# Patient Record
Sex: Female | Born: 1980 | Race: White | Hispanic: No | Marital: Married | Smoking: Never smoker
Health system: Southern US, Community
[De-identification: ages and names within clinical notes are randomized; demographics above are authoritative.]

## PROBLEM LIST (undated history)

## (undated) ENCOUNTER — Inpatient Hospital Stay (HOSPITAL_COMMUNITY): Payer: Self-pay

## (undated) DIAGNOSIS — I10 Essential (primary) hypertension: Secondary | ICD-10-CM

## (undated) DIAGNOSIS — E785 Hyperlipidemia, unspecified: Secondary | ICD-10-CM

## (undated) DIAGNOSIS — T7840XA Allergy, unspecified, initial encounter: Secondary | ICD-10-CM

## (undated) DIAGNOSIS — O139 Gestational [pregnancy-induced] hypertension without significant proteinuria, unspecified trimester: Secondary | ICD-10-CM

## (undated) DIAGNOSIS — F419 Anxiety disorder, unspecified: Secondary | ICD-10-CM

## (undated) DIAGNOSIS — K219 Gastro-esophageal reflux disease without esophagitis: Secondary | ICD-10-CM

## (undated) DIAGNOSIS — Z789 Other specified health status: Secondary | ICD-10-CM

## (undated) DIAGNOSIS — R87629 Unspecified abnormal cytological findings in specimens from vagina: Secondary | ICD-10-CM

## (undated) DIAGNOSIS — D649 Anemia, unspecified: Secondary | ICD-10-CM

## (undated) HISTORY — DX: Hyperlipidemia, unspecified: E78.5

## (undated) HISTORY — DX: Anxiety disorder, unspecified: F41.9

## (undated) HISTORY — DX: Allergy, unspecified, initial encounter: T78.40XA

## (undated) HISTORY — PX: MANDIBLE SURGERY: SHX707

---

## 2002-11-06 ENCOUNTER — Other Ambulatory Visit: Admission: RE | Admit: 2002-11-06 | Discharge: 2002-11-06 | Payer: Self-pay | Admitting: Obstetrics and Gynecology

## 2003-08-14 ENCOUNTER — Encounter: Admission: RE | Admit: 2003-08-14 | Discharge: 2003-08-14 | Payer: Self-pay | Admitting: *Deleted

## 2003-09-15 ENCOUNTER — Encounter: Admission: RE | Admit: 2003-09-15 | Discharge: 2003-12-14 | Payer: Self-pay | Admitting: *Deleted

## 2003-11-17 ENCOUNTER — Other Ambulatory Visit: Admission: RE | Admit: 2003-11-17 | Discharge: 2003-11-17 | Payer: Self-pay | Admitting: Obstetrics and Gynecology

## 2005-05-17 ENCOUNTER — Ambulatory Visit: Payer: Self-pay | Admitting: Surgery

## 2011-06-20 ENCOUNTER — Ambulatory Visit: Payer: Self-pay | Admitting: General Practice

## 2013-02-04 ENCOUNTER — Emergency Department: Payer: Self-pay | Admitting: Emergency Medicine

## 2013-02-04 LAB — URINALYSIS, COMPLETE
Bacteria: NONE SEEN
Bilirubin,UR: NEGATIVE
Blood: NEGATIVE
Glucose,UR: NEGATIVE mg/dL (ref 0–75)
Ketone: NEGATIVE
Leukocyte Esterase: NEGATIVE
Nitrite: NEGATIVE
Ph: 5 (ref 4.5–8.0)
Protein: NEGATIVE
RBC,UR: <1 /HPF (ref 0–5)
Specific Gravity: 1.005 (ref 1.003–1.030)
Squamous Epithelial: 1
WBC UR: 1 /HPF (ref 0–5)

## 2013-02-04 LAB — PREGNANCY, URINE: Pregnancy Test, Urine: NEGATIVE m[IU]/mL

## 2013-02-04 LAB — COMPREHENSIVE METABOLIC PANEL
Albumin: 3.9 g/dL (ref 3.4–5.0)
Alkaline Phosphatase: 64 U/L (ref 50–136)
Anion Gap: 8 (ref 7–16)
BUN: 7 mg/dL (ref 7–18)
Bilirubin,Total: 0.4 mg/dL (ref 0.2–1.0)
Calcium, Total: 9.1 mg/dL (ref 8.5–10.1)
Chloride: 103 mmol/L (ref 98–107)
Co2: 26 mmol/L (ref 21–32)
Creatinine: 0.78 mg/dL (ref 0.60–1.30)
EGFR (African American): >60
EGFR (Non-African Amer.): >60
Glucose: 88 mg/dL (ref 65–99)
Osmolality: 271 (ref 275–301)
Potassium: 3.8 mmol/L (ref 3.5–5.1)
SGOT(AST): 18 U/L (ref 15–37)
SGPT (ALT): 21 U/L (ref 12–78)
Sodium: 137 mmol/L (ref 136–145)
Total Protein: 8 g/dL (ref 6.4–8.2)

## 2013-02-04 LAB — CBC
HCT: 39.2 % (ref 35.0–47.0)
HGB: 13.9 g/dL (ref 12.0–16.0)
MCH: 30 pg (ref 26.0–34.0)
MCHC: 35.5 g/dL (ref 32.0–36.0)
MCV: 85 fL (ref 80–100)
Platelet: 350 10*3/uL (ref 150–440)
RBC: 4.62 10*6/uL (ref 3.80–5.20)
RDW: 12.7 % (ref 11.5–14.5)
WBC: 8.7 10*3/uL (ref 3.6–11.0)

## 2013-02-04 LAB — TROPONIN I: Troponin-I: <0.02 ng/mL

## 2015-07-27 LAB — OB RESULTS CONSOLE RPR: RPR: NONREACTIVE

## 2015-07-27 LAB — OB RESULTS CONSOLE ABO/RH: RH Type: POSITIVE

## 2015-07-27 LAB — OB RESULTS CONSOLE RUBELLA ANTIBODY, IGM: RUBELLA: IMMUNE

## 2015-07-27 LAB — OB RESULTS CONSOLE HEPATITIS B SURFACE ANTIGEN: Hepatitis B Surface Ag: NEGATIVE

## 2015-07-27 LAB — OB RESULTS CONSOLE ANTIBODY SCREEN: ANTIBODY SCREEN: NEGATIVE

## 2015-07-27 LAB — OB RESULTS CONSOLE HIV ANTIBODY (ROUTINE TESTING): HIV: NONREACTIVE

## 2015-08-05 LAB — OB RESULTS CONSOLE GC/CHLAMYDIA
CHLAMYDIA, DNA PROBE: NEGATIVE
GC PROBE AMP, GENITAL: NEGATIVE

## 2015-10-05 ENCOUNTER — Other Ambulatory Visit (HOSPITAL_COMMUNITY): Payer: Self-pay | Admitting: Obstetrics and Gynecology

## 2015-10-05 DIAGNOSIS — Z3A19 19 weeks gestation of pregnancy: Secondary | ICD-10-CM

## 2015-10-05 DIAGNOSIS — Z3689 Encounter for other specified antenatal screening: Secondary | ICD-10-CM

## 2015-10-05 DIAGNOSIS — R772 Abnormality of alphafetoprotein: Secondary | ICD-10-CM

## 2015-10-06 ENCOUNTER — Other Ambulatory Visit (HOSPITAL_COMMUNITY): Payer: Self-pay | Admitting: Obstetrics and Gynecology

## 2015-10-06 ENCOUNTER — Encounter (HOSPITAL_COMMUNITY): Payer: Self-pay

## 2015-10-06 ENCOUNTER — Ambulatory Visit (HOSPITAL_COMMUNITY)
Admission: RE | Admit: 2015-10-06 | Discharge: 2015-10-06 | Disposition: A | Discharge status: Home / Self Care | Payer: BC Managed Care – PPO | Source: Ambulatory Visit | Attending: Obstetrics and Gynecology | Admitting: Obstetrics and Gynecology

## 2015-10-06 DIAGNOSIS — O280 Abnormal hematological finding on antenatal screening of mother: Secondary | ICD-10-CM

## 2015-10-06 DIAGNOSIS — O99212 Obesity complicating pregnancy, second trimester: Secondary | ICD-10-CM

## 2015-10-06 DIAGNOSIS — R772 Abnormality of alphafetoprotein: Secondary | ICD-10-CM

## 2015-10-06 DIAGNOSIS — O09522 Supervision of elderly multigravida, second trimester: Secondary | ICD-10-CM | POA: Diagnosis not present

## 2015-10-06 DIAGNOSIS — O283 Abnormal ultrasonic finding on antenatal screening of mother: Secondary | ICD-10-CM | POA: Diagnosis not present

## 2015-10-06 DIAGNOSIS — Z3689 Encounter for other specified antenatal screening: Secondary | ICD-10-CM

## 2015-10-06 DIAGNOSIS — Z3A19 19 weeks gestation of pregnancy: Secondary | ICD-10-CM | POA: Diagnosis not present

## 2015-10-06 DIAGNOSIS — Z36 Encounter for antenatal screening of mother: Secondary | ICD-10-CM | POA: Diagnosis not present

## 2015-10-08 ENCOUNTER — Encounter (HOSPITAL_COMMUNITY): Payer: Self-pay

## 2015-10-08 ENCOUNTER — Other Ambulatory Visit (HOSPITAL_COMMUNITY): Payer: Self-pay

## 2015-10-10 NOTE — L&D Delivery Note (Signed)
Delivery Note At 5:13 PM a viable and healthy female was delivered via Vaginal, Spontaneous Delivery (Presentation: LOP_).  APGAR: 9,9 ; weight pending .   Placenta status: spontaneious, intact.  Cord:  with the following complications: [place] reduced.  Cord pH: na  Anesthesia: Epidural  Episiotomy:  na Lacerations:  second Suture Repair: 2.0 vicryl rapide Est. Blood Loss (mL):  100  Mom to postpartum.  Baby to Couplet care / Skin to Skin.  Christina [name] 02/11/2016, 5:32 PM

## 2015-10-15 ENCOUNTER — Ambulatory Visit (HOSPITAL_COMMUNITY): Admission: RE | Admit: 2015-10-15 | Payer: Self-pay | Source: Ambulatory Visit

## 2015-10-15 ENCOUNTER — Encounter (HOSPITAL_COMMUNITY): Payer: Self-pay

## 2015-11-26 ENCOUNTER — Ambulatory Visit (INDEPENDENT_AMBULATORY_CARE_PROVIDER_SITE_OTHER): Payer: BC Managed Care – PPO

## 2015-11-26 ENCOUNTER — Ambulatory Visit
Admission: EM | Admit: 2015-11-26 | Discharge: 2015-11-26 | Disposition: A | Discharge status: Home / Self Care | Payer: BC Managed Care – PPO | Attending: Family Medicine | Admitting: Family Medicine

## 2015-11-26 DIAGNOSIS — M79671 Pain in right foot: Secondary | ICD-10-CM | POA: Diagnosis not present

## 2015-11-26 NOTE — Discharge Instructions (Signed)
Heat Therapy  Heat therapy can help ease sore, stiff, injured, and tight muscles and joints. Heat relaxes your muscles, which may help ease your pain.   RISKS AND COMPLICATIONS  If you have any of the following conditions, do not use heat therapy unless your health care provider has approved:   Poor circulation.   Healing wounds or scarred skin in the area being treated.   Diabetes, heart disease, or high blood pressure.   Not being able to feel (numbness) the area being treated.   Unusual swelling of the area being treated.   Active infections.   Blood clots.   Cancer.   Inability to communicate pain. This may include young children and people who have problems with their brain function (dementia).   Pregnancy.  Heat therapy should only be used on old, pre-existing, or long-lasting (chronic) injuries. Do not use heat therapy on new injuries unless directed by your health care provider.  HOW TO USE HEAT THERAPY  There are several different kinds of heat therapy, including:   Moist heat pack.   Warm water bath.   Hot water bottle.   Electric heating pad.   Heated gel pack.   Heated wrap.   Electric heating pad.  Use the heat therapy method suggested by your health care provider. Follow your health care provider's instructions on when and how to use heat therapy.  GENERAL HEAT THERAPY RECOMMENDATIONS   Do not sleep while using heat therapy. Only use heat therapy while you are awake.   Your skin may turn pink while using heat therapy. Do not use heat therapy if your skin turns red.   Do not use heat therapy if you have new pain.   High heat or long exposure to heat can cause burns. Be careful when using heat therapy to avoid burning your skin.   Do not use heat therapy on areas of your skin that are already irritated, such as with a rash or sunburn.  SEEK MEDICAL CARE IF:   You have blisters, redness, swelling, or numbness.   You have new pain.   Your pain is worse.  MAKE SURE  YOU:   Understand these instructions.   Will watch your condition.   Will get help right away if you are not doing well or get worse.     This information is not intended to replace advice given to you by your health care provider. Make sure you discuss any questions you have with your health care provider.     Document Released: 12/18/2011 Document Revised: 10/16/2014 Document Reviewed: 11/18/2013  Elsevier Interactive Patient Education 2016 [employer]

## 2015-11-26 NOTE — ED Notes (Signed)
States fell off a curb last week and since the beginning of this week has had  Pain top and bottom of right foot. Painful to weight bear

## 2015-11-26 NOTE — ED Provider Notes (Signed)
CSN: [id number]     Arrival date & time 11/26/15  4098 History   First MD Initiated Contact with Patient 11/26/15 1106     Chief Complaint  Patient presents with  . Foot Pain   (Consider location/radiation/quality/duration/timing/severity/associated sxs/prior Treatment) HPI   [name] that is [redacted] weeks pregnant states that one week ago she was not watching where she was going and inadvertently stepped off of a curb twisting her right ankle into eversion. At first it seemed to go away without any problem but now for the last several days as noticed it has become very painful to ambulate. 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 Although this is not her major problem. She states that her pain is mostly on the great toe side and into the arch. She is hobbling whenever she walks.  History reviewed. No pertinent past medical history. Past Surgical History  Procedure Laterality Date  . Mandible surgery     Family History  Problem Relation Age of Onset  . Rheum arthritis Neg Hx   . Osteoarthritis Neg Hx    Social History  Substance Use Topics  . Smoking status: Never Smoker   . Smokeless tobacco: None  . Alcohol Use: No   OB History    Gravida Para Term Preterm AB TAB SAB Ectopic Multiple Living       Review of Systems  Constitutional: Positive for activity change. Negative for fever, diaphoresis, appetite change and fatigue.  Musculoskeletal: Positive for arthralgias.  All other systems reviewed and are negative.   Allergies  Erythromycin  Home Medications   Prior to Admission medications   Medication Sig Start Date End Date Taking? Authorizing Provider  Butalbital-APAP-Caffeine (FIORICET PO) Take by mouth.   Yes Historical Provider, MD  Prenatal Vit-Fe Fumarate-FA (PRENATAL MULTIVITAMIN) TABS tablet Take 1 tablet by  mouth daily at 12 noon.   Yes Historical Provider, MD   Meds Ordered and Administered this Visit  Medications - No data to display  BP 133/97 mmHg  Pulse 108  Temp(Src) 97.9 F (36.6 C) (Tympanic)  Resp 16  Ht  (1.702 m)  Wt 223 lb (101.152 kg)  BMI 34.92 kg/m2  SpO2 100%  LMP 05/15/2015 No data found.   Physical Exam  Constitutional: She is oriented to person, place, and time. She appears well-developed and well-nourished. No distress.  HENT:  Head: Normocephalic and atraumatic.  Eyes: Conjunctivae are normal. Pupils are equal, round, and reactive to light.  Neck: Normal range of motion. Neck supple.  Musculoskeletal: She exhibits tenderness.  Examination of the right foot and ankle shows fairly good range of motion of the ankle to plantar flexion, dorsiflexion and subtalar motion. Is no appreciable erythema ecchymosis or swelling. Distal capillary refill is less than 3 seconds. Maximum tenderness is at the tarsometatarsal joint of the great toe and into the arch of the foot to plantarly. There is no crepitus present.  Neurological: She is alert and oriented to person, place, and time.  Skin: Skin is warm and dry. She is not diaphoretic.  Psychiatric: She has a normal mood and affect. Her behavior is normal. Judgment and thought content normal.  Nursing note and vitals reviewed.   ED Course  Procedures (including critical care time)  Labs Review Labs Reviewed -  No data to display  Imaging Review Dg Foot Complete Right  11/26/2015  CLINICAL DATA:  35 year old female stepped off a curb 1 week ago and injured foot. Pain radiating to the arch. Patient is [redacted] weeks pregnant and was shielded for the exam. Initial encounter. EXAM: RIGHT FOOT COMPLETE - 3+ VIEW COMPARISON:  None. FINDINGS: Bone mineralization is within normal limits. Calcaneus intact. Joint spaces and alignment within normal limits. Tarsal bones intact. Metatarsals appear intact; there is mild benign appearing  cortical thickening along the lateral fourth metatarsal shaft. Phalanges intact. No acute osseous abnormality identified. IMPRESSION: No acute osseous abnormality identified in the right foot. Electronically Signed   By: Odessa Fleming M.D.   On: 11/26/2015 12:31     Visual Acuity Review  Right Eye Distance:   Left Eye Distance:   Bilateral Distance:    Right Eye Near:   Left Eye Near:    Bilateral Near:     13:15:14 Apply cam walker (Boot Orthosis) Completed LW  Apply cam walker (Boot Orthosis)         MDM   1. Foot arch pain, right    Discharge Medication List as of 11/26/2015  1:17 PM    Plan: 1. Test/x-ray results and diagnosis reviewed with patient 2. rx as per orders; risks, benefits, potential side effects reviewed with patient 3. Recommend supportive treatment with rest and elevation as necessary. However with the cam boot for activities and may come out of it to when she is at home quietly. I told her to watch the symptoms for about a week and if they are not improving she should go see a podiatrist. She may have ligamentous damage that is affecting the first TMT joint of her right foot. May need further evaluation and treatment. However out of work for a short time so that she can rest that area do recommend that she wear the boot while working. Because of her pregnancy she is only able to take Tylenol at the present time. Her use this as necessary. 4. F/u prn if symptoms worsen or don't improve      Lutricia Feil, PA-C 11/26/15 1417

## 2016-01-27 LAB — OB RESULTS CONSOLE GBS: GBS: NEGATIVE

## 2016-02-10 ENCOUNTER — Inpatient Hospital Stay (HOSPITAL_COMMUNITY)
Admission: RE | Admit: 2016-02-10 | Discharge: 2016-02-13 | DRG: 775 | Disposition: A | Discharge status: Home / Self Care | Payer: BC Managed Care – PPO | Source: Ambulatory Visit | Attending: Obstetrics and Gynecology | Admitting: Obstetrics and Gynecology

## 2016-02-10 ENCOUNTER — Encounter (HOSPITAL_COMMUNITY): Payer: Self-pay

## 2016-02-10 ENCOUNTER — Other Ambulatory Visit: Payer: Self-pay | Admitting: Obstetrics and Gynecology

## 2016-02-10 DIAGNOSIS — O1494 Unspecified pre-eclampsia, complicating childbirth: Secondary | ICD-10-CM | POA: Diagnosis present

## 2016-02-10 DIAGNOSIS — O9962 Diseases of the digestive system complicating childbirth: Secondary | ICD-10-CM | POA: Diagnosis present

## 2016-02-10 DIAGNOSIS — K219 Gastro-esophageal reflux disease without esophagitis: Secondary | ICD-10-CM | POA: Diagnosis present

## 2016-02-10 DIAGNOSIS — Z6836 Body mass index (BMI) 36.0-36.9, adult: Secondary | ICD-10-CM

## 2016-02-10 DIAGNOSIS — O99214 Obesity complicating childbirth: Secondary | ICD-10-CM | POA: Diagnosis present

## 2016-02-10 DIAGNOSIS — O1404 Mild to moderate pre-eclampsia, complicating childbirth: Secondary | ICD-10-CM | POA: Diagnosis present

## 2016-02-10 DIAGNOSIS — E669 Obesity, unspecified: Secondary | ICD-10-CM | POA: Diagnosis present

## 2016-02-10 DIAGNOSIS — O365930 Maternal care for other known or suspected poor fetal growth, third trimester, not applicable or unspecified: Secondary | ICD-10-CM | POA: Diagnosis present

## 2016-02-10 DIAGNOSIS — O1490 Unspecified pre-eclampsia, unspecified trimester: Secondary | ICD-10-CM | POA: Diagnosis present

## 2016-02-10 DIAGNOSIS — Z3A37 37 weeks gestation of pregnancy: Secondary | ICD-10-CM | POA: Diagnosis not present

## 2016-02-10 DIAGNOSIS — Z8249 Family history of ischemic heart disease and other diseases of the circulatory system: Secondary | ICD-10-CM

## 2016-02-10 HISTORY — DX: Other specified health status: Z78.9

## 2016-02-10 LAB — COMPREHENSIVE METABOLIC PANEL
ALK PHOS: 86 U/L (ref 38–126)
ALT: 28 U/L (ref 14–54)
AST: 37 U/L (ref 15–41)
Albumin: 2.9 g/dL — ABNORMAL LOW (ref 3.5–5.0)
Anion gap: 10 (ref 5–15)
BUN: 7 mg/dL (ref 6–20)
CALCIUM: 8.8 mg/dL — AB (ref 8.9–10.3)
CHLORIDE: 109 mmol/L (ref 101–111)
CO2: 19 mmol/L — ABNORMAL LOW (ref 22–32)
CREATININE: 0.52 mg/dL (ref 0.44–1.00)
GFR calc Af Amer: >60 mL/min (ref >60)
Glucose, Bld: 106 mg/dL — ABNORMAL HIGH (ref 65–99)
Potassium: 3.9 mmol/L (ref 3.5–5.1)
Sodium: 138 mmol/L (ref 135–145)
Total Bilirubin: 0.7 mg/dL (ref 0.3–1.2)
Total Protein: 5.8 g/dL — ABNORMAL LOW (ref 6.5–8.1)

## 2016-02-10 LAB — TYPE AND SCREEN
ABO/RH(D): A POS
Antibody Screen: NEGATIVE

## 2016-02-10 LAB — CBC
HEMATOCRIT: 32.3 % — AB (ref 36.0–46.0)
HEMOGLOBIN: 10.9 g/dL — AB (ref 12.0–15.0)
MCH: 29.2 pg (ref 26.0–34.0)
MCHC: 33.7 g/dL (ref 30.0–36.0)
MCV: 86.6 fL (ref 78.0–100.0)
PLATELETS: 210 10*3/uL (ref 150–400)
RBC: 3.73 MIL/uL — AB (ref 3.87–5.11)
RDW: 15.2 % (ref 11.5–15.5)
WBC: 10.1 10*3/uL (ref 4.0–10.5)

## 2016-02-10 MED ORDER — ACETAMINOPHEN 325 MG PO TABS
650.0000 mg | ORAL_TABLET | ORAL | Status: DC | PRN
  Administered 2016-02-10 – 2016-02-11 (×3): 650 mg via ORAL
  Filled 2016-02-10 (×3): qty 2

## 2016-02-10 MED ORDER — FLEET ENEMA 7-19 GM/118ML RE ENEM: 1.0000 | ENEMA | RECTAL | Status: DC | PRN

## 2016-02-10 MED ORDER — ONDANSETRON HCL 4 MG/2ML IJ SOLN: 4.0000 mg | Freq: Four times a day (QID) | INTRAMUSCULAR | Status: DC | PRN

## 2016-02-10 MED ORDER — FENTANYL CITRATE (PF) 100 MCG/2ML IJ SOLN: 50.0000 ug | INTRAMUSCULAR | Status: DC | PRN

## 2016-02-10 MED ORDER — OXYCODONE-ACETAMINOPHEN 5-325 MG PO TABS: 1.0000 | ORAL_TABLET | ORAL | Status: DC | PRN

## 2016-02-10 MED ORDER — OXYTOCIN BOLUS FROM INFUSION
500.0000 mL | INTRAVENOUS | Status: DC
  Administered 2016-02-11: 500 mL via INTRAVENOUS

## 2016-02-10 MED ORDER — OXYTOCIN 10 UNIT/ML IJ SOLN: 2.5000 [IU]/h | INTRAVENOUS | Status: DC

## 2016-02-10 MED ORDER — LACTATED RINGERS IV SOLN
500.0000 mL | INTRAVENOUS | Status: DC | PRN
  Administered 2016-02-11: 500 mL via INTRAVENOUS

## 2016-02-10 MED ORDER — MISOPROSTOL 25 MCG QUARTER TABLET
25.0000 ug | ORAL_TABLET | ORAL | Status: DC | PRN
  Administered 2016-02-10 – 2016-02-11 (×3): 25 ug via VAGINAL
  Filled 2016-02-10 (×3): qty 0.25

## 2016-02-10 MED ORDER — OXYTOCIN 10 UNIT/ML IJ SOLN
1.0000 m[IU]/min | INTRAVENOUS | Status: DC
  Administered 2016-02-11: 2 m[IU]/min via INTRAVENOUS
  Filled 2016-02-10: qty 4

## 2016-02-10 MED ORDER — LIDOCAINE HCL (PF) 1 % IJ SOLN
30.0000 mL | INTRAMUSCULAR | Status: DC | PRN
  Filled 2016-02-10: qty 30

## 2016-02-10 MED ORDER — ZOLPIDEM TARTRATE 5 MG PO TABS
5.0000 mg | ORAL_TABLET | Freq: Every evening | ORAL | Status: DC | PRN
  Administered 2016-02-10: 5 mg via ORAL
  Filled 2016-02-10: qty 1

## 2016-02-10 MED ORDER — TERBUTALINE SULFATE 1 MG/ML IJ SOLN: 0.2500 mg | Freq: Once | INTRAMUSCULAR | Status: DC | PRN

## 2016-02-10 MED ORDER — OXYCODONE-ACETAMINOPHEN 5-325 MG PO TABS: 2.0000 | ORAL_TABLET | ORAL | Status: DC | PRN

## 2016-02-10 MED ORDER — CITRIC ACID-SODIUM CITRATE 334-500 MG/5ML PO SOLN
30.0000 mL | ORAL | Status: DC | PRN
  Administered 2016-02-11 (×2): 30 mL via ORAL
  Filled 2016-02-10 (×2): qty 15

## 2016-02-10 MED ORDER — LACTATED RINGERS IV SOLN
INTRAVENOUS | Status: DC
  Administered 2016-02-10 – 2016-02-11 (×3): via INTRAVENOUS

## 2016-02-10 NOTE — H&P (Signed)
Christina KingfisherRebecca Peterson [name] is a 35 y.o. female presenting for IOL for PEC without severe features.  Maternal Medical History:  Reason for admission: Nausea.  Contractions: Onset was less than 1 hour ago.   Perceived severity is mild.    Fetal activity: Perceived fetal activity is normal.   Last perceived fetal movement was within the past hour.    Prenatal complications: Pre-eclampsia.   Prenatal Complications - Diabetes: none.    OB History    Gravida Para Term Preterm AB TAB SAB Ectopic Multiple Living   2 0 0 0 1 0 1 0 0 0      Past Medical History  Diagnosis Date  . Medical history non-contributory    Past Surgical History  Procedure Laterality Date  . Mandible surgery     Family History: family history includes Hypertension in her brother, father, and maternal grandmother. There is no history of Rheum arthritis or Osteoarthritis. Social History:  reports that she has never smoked. She does not have any smokeless tobacco history on file. She reports that she does not drink alcohol or use illicit drugs.   Prenatal Transfer Tool  Maternal Diabetes: No Genetic Screening: Normal Maternal Ultrasounds/Referrals: Abnormal:  Findings:   Other: Fetal Ultrasounds or other Referrals:  Other: nl MFM sono Maternal Substance Abuse:  No Significant Maternal Medications:  None Significant Maternal Lab Results:  None Other Comments:  unexplained AFP elevation with nl MFM sono  Review of Systems  Constitutional: Negative.   Eyes: Positive for photophobia. Negative for blurred vision.  Gastrointestinal: Negative for heartburn and nausea.  Neurological: Negative for headaches.  All other systems reviewed and are negative.   Dilation: 1 Station: -3 Exam by:: McGraw-HillMurayyah Johnson  Blood pressure 138/85, pulse 91, temperature 98 F (36.7 C), temperature source Oral, resp. rate 16, height 5\' 7"  (1.702 m), weight 106.142 kg (234 lb), last menstrual period 05/15/2015. Maternal Exam:   Uterine Assessment: Contraction strength is mild.  Contraction frequency is rare.   Abdomen: Patient reports no abdominal tenderness. Fetal presentation: vertex  Introitus: Normal vulva. Normal vagina.  Ferning test: not done.  Nitrazine test: not done. Amniotic fluid character: not assessed.  Pelvis: questionable for delivery.   Cervix: Cervix evaluated by digital exam.     Physical Exam  Nursing note and vitals reviewed. Constitutional: She is oriented to person, place, and time. She appears well-developed and well-nourished.  HENT:  Head: Normocephalic and atraumatic.  Neck: Normal range of motion. Neck supple.  Cardiovascular: Normal rate and regular rhythm.   Respiratory: Effort normal and breath sounds normal.  GI: Soft. Bowel sounds are normal.  Genitourinary: Vagina normal and uterus normal.  Musculoskeletal: Normal range of motion.  Neurological: She is alert and oriented to person, place, and time. She has normal reflexes.  Skin: Skin is warm and dry.  Psychiatric: She has a normal mood and affect.    Prenatal labs: ABO, Rh: --/--/A POS (05/04 2015) Antibody: NEG (05/04 2015) Rubella: Immune (10/18 0000) RPR: Nonreactive (10/18 0000)  HBsAg: Negative (10/18 0000)  HIV: Non-reactive (10/18 0000)  GBS: Negative (04/20 0000)  CBC    Component Value Date/Time   WBC 10.1 02/10/2016 2015   WBC 8.7 02/04/2013 1712   RBC 3.73* 02/10/2016 2015   RBC 4.62 02/04/2013 1712   HGB 10.9* 02/10/2016 2015   HGB 13.9 02/04/2013 1712   HCT 32.3* 02/10/2016 2015   HCT 39.2 02/04/2013 1712   PLT 210 02/10/2016 2015   PLT 350 02/04/2013 1712  MCV 86.6 02/10/2016 2015   MCV 85 02/04/2013 1712   MCH 29.2 02/10/2016 2015   MCH 30.0 02/04/2013 1712   MCHC 33.7 02/10/2016 2015   MCHC 35.5 02/04/2013 1712   RDW 15.2 02/10/2016 2015   RDW 12.7 02/04/2013 1712     Assessment/Plan: 37+ weeks PEC without severe features Unexplained MSAFP elevation Admit for  IOL   Elma Shands J 02/10/2016, 11:10 PM

## 2016-02-11 ENCOUNTER — Encounter (HOSPITAL_COMMUNITY): Payer: Self-pay | Admitting: Obstetrics

## 2016-02-11 ENCOUNTER — Inpatient Hospital Stay (HOSPITAL_COMMUNITY): Payer: BC Managed Care – PPO | Admitting: Anesthesiology

## 2016-02-11 LAB — CBC
HCT: 32.6 % — ABNORMAL LOW (ref 36.0–46.0)
HEMATOCRIT: 34.1 % — AB (ref 36.0–46.0)
HEMOGLOBIN: 11.3 g/dL — AB (ref 12.0–15.0)
Hemoglobin: 11.5 g/dL — ABNORMAL LOW (ref 12.0–15.0)
MCH: 29.5 pg (ref 26.0–34.0)
MCH: 30 pg (ref 26.0–34.0)
MCHC: 33.7 g/dL (ref 30.0–36.0)
MCHC: 34.7 g/dL (ref 30.0–36.0)
MCV: 87.4 fL (ref 78.0–100.0)
MCV: 86.5 fL (ref 78.0–100.0)
PLATELETS: 204 10*3/uL (ref 150–400)
Platelets: 229 10*3/uL (ref 150–400)
RBC: 3.9 MIL/uL (ref 3.87–5.11)
RBC: 3.77 MIL/uL — ABNORMAL LOW (ref 3.87–5.11)
RDW: 15.3 % (ref 11.5–15.5)
RDW: 15 % (ref 11.5–15.5)
WBC: 9.6 10*3/uL (ref 4.0–10.5)
WBC: 13.5 10*3/uL — AB (ref 4.0–10.5)

## 2016-02-11 LAB — RPR: RPR: NONREACTIVE

## 2016-02-11 LAB — ABO/RH: ABO/RH(D): A POS

## 2016-02-11 MED ORDER — METHYLERGONOVINE MALEATE 0.2 MG/ML IJ SOLN
0.2000 mg | INTRAMUSCULAR | Status: DC | PRN
Start: 2016-02-11 — End: 2016-02-13

## 2016-02-11 MED ORDER — ZOLPIDEM TARTRATE 5 MG PO TABS: 5.0000 mg | ORAL_TABLET | Freq: Every evening | ORAL | Status: DC | PRN

## 2016-02-11 MED ORDER — DIPHENHYDRAMINE HCL 50 MG/ML IJ SOLN: 12.5000 mg | INTRAMUSCULAR | Status: DC | PRN

## 2016-02-11 MED ORDER — ONDANSETRON HCL 4 MG PO TABS: 4.0000 mg | ORAL_TABLET | ORAL | Status: DC | PRN

## 2016-02-11 MED ORDER — IBUPROFEN 600 MG PO TABS
600.0000 mg | ORAL_TABLET | Freq: Four times a day (QID) | ORAL | Status: DC
  Administered 2016-02-11 – 2016-02-13 (×9): 600 mg via ORAL
  Filled 2016-02-11 (×9): qty 1

## 2016-02-11 MED ORDER — COCONUT OIL OIL
1.0000 "application " | TOPICAL_OIL | Status: DC | PRN
  Filled 2016-02-11 (×2): qty 120

## 2016-02-11 MED ORDER — PHENYLEPHRINE 40 MCG/ML (10ML) SYRINGE FOR IV PUSH (FOR BLOOD PRESSURE SUPPORT)
80.0000 ug | PREFILLED_SYRINGE | INTRAVENOUS | Status: DC | PRN
  Filled 2016-02-11: qty 10

## 2016-02-11 MED ORDER — SENNOSIDES-DOCUSATE SODIUM 8.6-50 MG PO TABS
2.0000 | ORAL_TABLET | ORAL | Status: DC
  Administered 2016-02-12 (×2): 2 via ORAL
  Filled 2016-02-11 (×3): qty 2

## 2016-02-11 MED ORDER — DIBUCAINE 1 % RE OINT: 1.0000 "application " | TOPICAL_OINTMENT | RECTAL | Status: DC | PRN

## 2016-02-11 MED ORDER — DIPHENHYDRAMINE HCL 25 MG PO CAPS: 25.0000 mg | ORAL_CAPSULE | Freq: Four times a day (QID) | ORAL | Status: DC | PRN

## 2016-02-11 MED ORDER — TETANUS-DIPHTH-ACELL PERTUSSIS 5-2.5-18.5 LF-MCG/0.5 IM SUSP: 0.5000 mL | Freq: Once | INTRAMUSCULAR | Status: DC

## 2016-02-11 MED ORDER — WITCH HAZEL-GLYCERIN EX PADS
1.0000 "application " | MEDICATED_PAD | CUTANEOUS | Status: DC | PRN
  Administered 2016-02-12: 1 via TOPICAL

## 2016-02-11 MED ORDER — LIDOCAINE HCL (PF) 1 % IJ SOLN
INTRAMUSCULAR | Status: DC | PRN
  Administered 2016-02-11 (×2): 4 mL via EPIDURAL

## 2016-02-11 MED ORDER — EPHEDRINE 5 MG/ML INJ: 10.0000 mg | INTRAVENOUS | Status: DC | PRN

## 2016-02-11 MED ORDER — ACETAMINOPHEN 325 MG PO TABS
650.0000 mg | ORAL_TABLET | ORAL | Status: DC | PRN
  Administered 2016-02-11 – 2016-02-13 (×3): 650 mg via ORAL
  Filled 2016-02-11 (×3): qty 2

## 2016-02-11 MED ORDER — PRENATAL MULTIVITAMIN CH
1.0000 | ORAL_TABLET | Freq: Every day | ORAL | Status: DC
  Administered 2016-02-12 – 2016-02-13 (×2): 1 via ORAL
  Filled 2016-02-11 (×2): qty 1

## 2016-02-11 MED ORDER — SIMETHICONE 80 MG PO CHEW: 80.0000 mg | CHEWABLE_TABLET | ORAL | Status: DC | PRN

## 2016-02-11 MED ORDER — LACTATED RINGERS IV SOLN: 500.0000 mL | Freq: Once | INTRAVENOUS | Status: DC

## 2016-02-11 MED ORDER — FENTANYL 2.5 MCG/ML BUPIVACAINE 1/10 % EPIDURAL INFUSION (WH - ANES)
14.0000 mL/h | INTRAMUSCULAR | Status: DC | PRN
  Administered 2016-02-11: 14 mL/h via EPIDURAL
  Filled 2016-02-11: qty 125

## 2016-02-11 MED ORDER — PHENYLEPHRINE 40 MCG/ML (10ML) SYRINGE FOR IV PUSH (FOR BLOOD PRESSURE SUPPORT): 80.0000 ug | PREFILLED_SYRINGE | INTRAVENOUS | Status: DC | PRN

## 2016-02-11 MED ORDER — BENZOCAINE-MENTHOL 20-0.5 % EX AERO
1.0000 "application " | INHALATION_SPRAY | CUTANEOUS | Status: DC | PRN
  Administered 2016-02-12 – 2016-02-13 (×2): 1 via TOPICAL
  Filled 2016-02-11 (×3): qty 56

## 2016-02-11 MED ORDER — METHYLERGONOVINE MALEATE 0.2 MG PO TABS: 0.2000 mg | ORAL_TABLET | ORAL | Status: DC | PRN

## 2016-02-11 MED ORDER — ONDANSETRON HCL 4 MG/2ML IJ SOLN: 4.0000 mg | INTRAMUSCULAR | Status: DC | PRN

## 2016-02-11 NOTE — Anesthesia Pain Management Evaluation Note (Signed)
  CRNA Pain Management Visit Note  Patient: Christina [name], 35 y.o., female  "Hello I am a member of the anesthesia team [facility]. We have an anesthesia team available at all times to provide care throughout the hospital, including epidural management and anesthesia for C-section. I don't know your plan for the delivery whether it a natural birth, water birth, IV sedation, nitrous supplementation, doula or epidural, but we want to meet your pain goals."   1.Was your pain managed to your expectations on prior hospitalizations?   Yes , no previous vaginal deliveries   2.What is your expectation for pain management during this hospitalization?     Epidural  3.How can we help you reach that goal? Open to epidural  Record the patient's initial score and the patient's pain goal.   Pain: 6  Pain Goal: 7 [facility] wants you to be able to say your pain was always managed very well.  William B Kessler Memorial HospitalMARSHALL,Christina [name] 02/11/2016

## 2016-02-11 NOTE — Lactation Note (Signed)
This note was copied from a baby's chart. Lactation Consultation Note Initial visit at 5 hours of age.  Mom reports a few good feedings and baby is latch now.  Mom holding baby STS in football hold in reclined back position.  Baby has wide gape and strong rhythmic sucking with few breaks.  Mom noted to have slightly wide spaced breast, maybe just positioning.  Mom reports + breast changes during pregnancy.  Mom denies pain with latch.  [facility] given and discussed.  Encouraged to feed with early cues on demand.  Early newborn behavior discussed.  Hand expression reported by mom with colostrum visible.  Mom to call for assist as needed.    Patient Name: Christina [name] ZOXWR'UToday's Date: 02/11/2016 Reason for consult: Initial assessment   Maternal Data Has patient been taught Hand Expression?: Yes Does the patient have breastfeeding experience prior to this delivery?: No  Feeding Feeding Type: Breast Fed Length of feed: 15 min  LATCH Score/Interventions Latch: Grasps breast easily, tongue down, lips flanged, rhythmical sucking. Intervention(s): Skin to skin  Audible Swallowing: A few with stimulation  Type of Nipple: Everted at rest and after stimulation  Comfort (Breast/Nipple): Soft / non-tender     Hold (Positioning): No assistance needed to correctly position infant at breast.  LATCH Score: 9  Lactation Tools Discussed/Used     Consult Status Consult Status: Follow-up Date: 02/12/16 Follow-up type: In-patient    [name], [name] 02/11/2016, 11:10 PM

## 2016-02-11 NOTE — Anesthesia Preprocedure Evaluation (Signed)
Anesthesia Evaluation  Patient identified by MRN, date of birth, ID band Patient awake    Reviewed: Allergy & Precautions, Patient's Chart, lab work & pertinent test results, reviewed documented beta blocker date and time   Airway Mallampati: III  TM Distance: >3 FB Neck ROM: Full    Dental no notable dental hx. (+) Teeth Intact   Pulmonary neg pulmonary ROS,    Pulmonary exam normal breath sounds clear to auscultation       Cardiovascular hypertension, Pt. on home beta blockers and Pt. on medications Normal cardiovascular exam Rhythm:Regular Rate:Normal     Neuro/Psych negative neurological ROS  negative psych ROS   GI/Hepatic Neg liver ROS, GERD  ,  Endo/Other  Obesity  Renal/GU negative Renal ROS  negative genitourinary   Musculoskeletal negative musculoskeletal ROS (+)   Abdominal (+) + obese,   Peds  Hematology  (+) anemia ,   Anesthesia Other Findings   Reproductive/Obstetrics (+) Pregnancy                             Anesthesia Physical Anesthesia Plan  ASA: II  Anesthesia Plan: Epidural   Post-op Pain Management:    Induction:   Airway Management Planned: Natural Airway  Additional Equipment:   Intra-op Plan:   Post-operative Plan:   Informed Consent: I have reviewed the patients History and Physical, chart, labs and discussed the procedure including the risks, benefits and alternatives for the proposed anesthesia with the patient or authorized representative who has indicated his/her understanding and acceptance.     Plan Discussed with: Anesthesiologist  Anesthesia Plan Comments:         Anesthesia Quick Evaluation

## 2016-02-11 NOTE — Progress Notes (Signed)
Kenton KingfisherRebecca Peterson Manders is a 35 y.o. G2P0010 at 1853w3d by LMP admitted for induction of labor due to Poor fetal growth.  Subjective: Comfortable, no headache or epigastric pain  Objective: BP 132/75 mmHg  Pulse 87  Temp(Src) 98 F (36.7 C) (Oral)  Resp 20  Ht 5\' 7"  (1.702 m)  Wt 106.142 kg (234 lb)  BMI 36.64 kg/m2  LMP 05/15/2015      FHT:  FHR: 145 bpm, variability: moderate,  accelerations:  Present,  decelerations:  Absent UC:   irregular, every 2-5 minutes SVE:   2-3/60/-1 AROM- clear Labs: Lab Results  Component Value Date   WBC 10.1 02/10/2016   HGB 10.9* 02/10/2016   HCT 32.3* 02/10/2016   MCV 86.6 02/10/2016   PLT 210 02/10/2016    Assessment / Plan: Induction of labor due to preeclampsia,  progressing well on pitocin  Labor: Progressing normally Preeclampsia:  no signs or symptoms of toxicity, intake and ouput balanced and labs stable Fetal Wellbeing:  Category I Pain Control:  Labor support without medications I/D:  n/a Anticipated MOD:  NSVD  Laure Leone J 02/11/2016, 7:54 AM

## 2016-02-11 NOTE — Progress Notes (Signed)
Patient has epidural, rates pain a "0".

## 2016-02-11 NOTE — Anesthesia Procedure Notes (Addendum)
Epidural Patient location during procedure: OB Start time: 02/11/2016 11:26 AM  Staffing Anesthesiologist: Mal AmabileFOSTER, Tekeshia Klahr Performed by: anesthesiologist   Preanesthetic Checklist Completed: patient identified, site marked, surgical consent, pre-op evaluation, timeout performed, IV checked, risks and benefits discussed and monitors and equipment checked  Epidural Patient position: sitting Prep: site prepped and draped and DuraPrep Patient monitoring: continuous pulse ox and blood pressure Approach: midline Location: L3-L4 Injection technique: LOR air  Needle:  Needle type: Tuohy  Needle gauge: 17 G Needle length: 9 cm and 9 Needle insertion depth: 8 cm Catheter type: closed end flexible Catheter size: 19 Gauge Catheter at skin depth: 13 cm Test dose: negative and Other  Assessment Events: blood not aspirated, injection not painful, no injection resistance, negative IV test and no paresthesia  Additional Notes Patient identified. Risks and benefits discussed including failed block, incomplete  Pain control, post dural puncture headache, nerve damage, paralysis, blood pressure Changes, nausea, vomiting, reactions to medications-both toxic and allergic and post Partum back pain. All questions were answered. Patient expressed understanding and wished to proceed. Sterile technique was used throughout procedure. Epidural site was Dressed with sterile barrier dressing. No paresthesias, signs of intravascular injection Or signs of intrathecal spread were encountered.  Patient was more comfortable after the epidural was dosed. Please see RN's note for documentation of vital signs and FHR which are stable.

## 2016-02-12 LAB — CBC
HCT: 31.4 % — ABNORMAL LOW (ref 36.0–46.0)
HEMOGLOBIN: 10.7 g/dL — AB (ref 12.0–15.0)
MCH: 29.2 pg (ref 26.0–34.0)
MCHC: 34.1 g/dL (ref 30.0–36.0)
MCV: 85.8 fL (ref 78.0–100.0)
PLATELETS: 221 10*3/uL (ref 150–400)
RBC: 3.66 MIL/uL — AB (ref 3.87–5.11)
RDW: 14.9 % (ref 11.5–15.5)
WBC: 11 10*3/uL — AB (ref 4.0–10.5)

## 2016-02-12 NOTE — Progress Notes (Signed)
Patient ID: Christina [name], female   DOB: December 15, 1980, 35 y.o.   MRN: [id number] PPD # 1 SVD  S:  Reports feeling well.             Tolerating po/ No nausea or vomiting             Bleeding is light             Pain controlled with ibuprofen (OTC)             Up ad lib / ambulatory / voiding without difficulties    Newborn  Information for the patient's newborn:  [name], [name] [[id number]][[id number]]  female  breast feeding     O:  A & O x 3, in no apparent distress              VS:  Filed Vitals:   02/11/16 1950 02/11/16 2100 02/12/16 0011 02/12/16 0900  BP: 137/85 135/84 135/83 122/88  Pulse: 97 94 84 92  Temp: 98.5 F (36.9 C) 98.4 F (36.9 C)  97.5 F (36.4 C)  TempSrc: Oral Oral  Oral  Resp: 18 18 18    Height:      Weight:      SpO2: 99% 98%  97%    LABS:  Recent Labs  02/11/16 1808 02/12/16 0526  WBC 13.5* 11.0*  HGB 11.3* 10.7*  HCT 32.6* 31.4*  PLT 229 221    Blood type: --/--/A POS, A POS (05/04 2015)  Rubella: Immune (10/18 0000)   I&O: I/O last 3 completed shifts: In: -  Out: 150 [Blood:150]             Lungs: Clear and unlabored  Heart: regular rate and rhythm / no murmurs  Abdomen: soft, non-tender, non-distended             Fundus: firm, non-tender, U-1  Perineum: 2nd degree repair healing well, no edema  Lochia: minimal  Extremities: No edema, no calf pain or tenderness, No Homans    A/P: PPD # 1  35 y.o., [id number]   Principal Problem:   Postpartum care following vaginal delivery (5/5) Active Problems:   Preeclampsia   Doing well - stable status  Routine post partum orders  Anticipate discharge tomorrow    Raelyn MoraAWSON, Karn Derk, M, MSN, CNM 02/12/2016, 9:47 AM

## 2016-02-12 NOTE — Anesthesia Postprocedure Evaluation (Signed)
Anesthesia Post Note  Patient: Christina [name]  Procedure(s) Performed: * No procedures listed *  Patient location during evaluation: Mother Baby Anesthesia Type: Epidural Level of consciousness: awake and alert and oriented Pain management: satisfactory to patient Vital Signs Assessment: post-procedure vital signs reviewed and stable Respiratory status: spontaneous breathing and nonlabored ventilation Cardiovascular status: stable Postop Assessment: no headache, no backache, no signs of nausea or vomiting, adequate PO intake and patient able to bend at knees (patient up walking) Anesthetic complications: no     Last Vitals:  Filed Vitals:   02/11/16 2100 02/12/16 0011  BP: 135/84 135/83  Pulse: 94 84  Temp: 36.9 C   Resp: 18 18    Last Pain:  Filed Vitals:   02/12/16 0544  PainSc: 4    Pain Goal: Patients Stated Pain Goal: 3 (02/11/16 1042)               Madison HickmanGREGORY,Arvel Oquinn

## 2016-02-12 NOTE — Lactation Note (Addendum)
This note was copied from a baby's chart. Lactation Consultation Note  Patient Name: Girl York SpanielRebecca Rorke NWGNF'AToday's Date: 02/12/2016 Reason for consult: Follow-up assessment  Baby 22 hours old. Parents state that mom is just finishing nursing baby on right breast in football position. Baby at breast, but only on the tip of mom's nipple. Mom denies any nipple pain. Baby sleeping at breast. Parents report that baby nursed well for 15 minutes. Discussed with parents that baby has been going for 4-5 hours between feedings. Enc parents to feed with cues, but offer lots of STS so that baby will probably cue earlier. Baby cueing to nurse and mom reports more difficulty latching to left breast. So, assisted mom to latch baby to left breast in football position. Baby latched easily/deeply with lips flanged and suckled rhythmically with a few swallows noted. Enc mom to continue stimulating baby to keep baby actively nursing. Also enc mom to compress breast while baby nursing. Mom able to express colostrum easily, and states that she has been seeing colostrum with each feeding.  Mom's breasts are v-shaped with large, well-everted nipples, but small amount of glandular tissue. Enc mom to call for assistance as needed with latching as needed. Discussed assessment and interventions with patient's nurse, Juliette AlcideMelinda, RN.     Maternal Data    Feeding Feeding Type: Breast Fed Length of feed:  (LC assessed first 10 minutes of BF. )  LATCH Score/Interventions Latch: Grasps breast easily, tongue down, lips flanged, rhythmical sucking.  Audible Swallowing: A few with stimulation Intervention(s): Skin to skin;Hand expression  Type of Nipple: Everted at rest and after stimulation  Comfort (Breast/Nipple): Soft / non-tender     Hold (Positioning): Assistance needed to correctly position infant at breast and maintain latch. Intervention(s): Breastfeeding basics reviewed;Support Pillows;Position options;Skin to  skin  LATCH Score: 8  Lactation Tools Discussed/Used     Consult Status Consult Status: Follow-up Date: 02/13/16 Follow-up type: In-patient    Geralynn OchsWILLIARD, Mikell Camp 02/12/2016, 3:16 PM

## 2016-02-13 MED ORDER — IBUPROFEN 600 MG PO TABS: 600.0000 mg | ORAL_TABLET | Freq: Four times a day (QID) | ORAL | Status: DC

## 2016-02-13 NOTE — Progress Notes (Signed)
PPD 2 SVD with 2nd degree repair  S:  Reports feeling well             Tolerating po/ No nausea or vomiting             Bleeding is light             Pain controlled with motrin             Up ad lib / ambulatory / voiding QS  Newborn breast-feeding with some latch difficulty - working with lactation / female O:               VS: BP 138/88 mmHg  Pulse 82  Temp(Src) 98.4 F (36.9 C) (Oral)  Resp 18  Ht 5\' 7"  (1.702 m)  Wt 106.142 kg (234 lb)  BMI 36.64 kg/m2  SpO2 99%  LMP 05/15/2015  Breastfeeding? Unknown              BP: 128/92 - 122/88 - 135/83   LABS:              Recent Labs  02/11/16 1808 02/12/16 0526  WBC 13.5* 11.0*  HGB 11.3* 10.7*  PLT 229 221               Blood type: --/--/A POS, A POS (05/04 2015)  Rubella: Immune (10/18 0000)               tdap current                             Physical Exam:             Alert and oriented X3  Abdomen: soft, non-tender, non-distended              Fundus: firm, non-tender, U-1  Perineum: no edema  Lochia: light  Extremities: trace edema, no calf pain or tenderness    A: PPD # 2              Mild pre-eclampsia  Doing well - stable status  P: Routine post partum orders  DC home             [facility]  Marlinda MikeBAILEY, Tema Alire CNM, MSN, [facility] 02/13/2016, 8:35 AM

## 2016-02-13 NOTE — Lactation Note (Signed)
This note was copied from a baby's chart. Lactation Consultation Note  Patient Name: Christina York SpanielRebecca Czaja ZOXWR'UToday's Date: 02/13/2016 Reason for consult: Follow-up assessment  Baby 40 hours old. Mom nursing baby at right breast in modified cross-cradle position. Baby sleepy at breast, and few swallows heard. Mom reports sore nipples. This LC able to hand express drops of colostrum. Enc mom to use for sore nipples after each feeding. Assisted mom to latch baby to right breast in football position. Baby latched deeply, but suckles were non nutritive. Set mom up with DEBP and mom pumped for 15 minutes. However, mom not able to obtain any colostrum. Mom tearful and baby fussy/crying. Parents agreed to supplementing baby with Alimentum. Assisted parents to syringe and finger-feed baby 7 ml of Alimentum and baby tolerated well.   Plan is for mom to put baby to breast with cues and at least every 3 hours, then supplement with formula using syringe and finger--following supplementation guidelines, which were given with review. Enc parents to increase supplementation amount with the next few feedings, and be prepared to feed baby often in order to catch up to what she needs. Enc mom to post-pump after each feeding for 15 minutes followed by hand expression.  Mom reports that she does not believe that she is having any breast changes. Enc mom to continue pumping, but to supplement baby with formula according to guidelines. Discussed using a Dr. Manson PasseyBrown bottle with wide based nipple as supplementation amounts increase if mom's milk not to volume and baby still needing to be supplemented. Parents enc to call nurse for assistance with feeding/pumping as needed. Parents aware of OP/BFSG and LC phone line assistance after D/C.   Discussed assessment and interventions with patient's bedside nurse, Juliette AlcideMelinda, RN.  Maternal Data    Feeding Feeding Type: Breast Fed Length of feed: 15 min  LATCH Score/Interventions Latch:  Grasps breast easily, tongue down, lips flanged, rhythmical sucking. Intervention(s): Skin to skin;Waking techniques Intervention(s): Adjust position;Assist with latch;Breast compression  Audible Swallowing: A few with stimulation Intervention(s): Skin to skin;Hand expression  Type of Nipple: Everted at rest and after stimulation  Comfort (Breast/Nipple): Filling, red/small blisters or bruises, mild/mod discomfort  Problem noted: Mild/Moderate discomfort Interventions (Mild/moderate discomfort): Comfort gels;Post-pump  Hold (Positioning): Assistance needed to correctly position infant at breast and maintain latch. Intervention(s): Support Pillows  LATCH Score: 7  Lactation Tools Discussed/Used Pump Review: Setup, frequency, and cleaning;Milk Storage Initiated by:: JW Date initiated:: 02/13/16   Consult Status Consult Status: Follow-up Date: 02/14/16 Follow-up type: In-patient    Geralynn OchsWILLIARD, Terril Chestnut 02/13/2016, 9:44 AM

## 2016-02-13 NOTE — Discharge Summary (Signed)
Obstetric Discharge Summary  Reason for Admission: induction of labor for pre-eclampsia Prenatal Procedures: NST, ultrasound and serial labs Intrapartum Procedures: spontaneous vaginal delivery Postpartum Procedures: none Complications-Operative and Postpartum: 2nd degree perineal laceration HEMOGLOBIN  Date Value Ref Range Status  02/12/2016 10.7* 12.0 - 15.0 g/dL Final   HGB  Date Value Ref Range Status  02/04/2013 13.9 12.0-16.0 g/dL Final   HCT  Date Value Ref Range Status  02/12/2016 31.4* 36.0 - 46.0 % Final  02/04/2013 39.2 35.0-47.0 % Final    Physical Exam:  General: alert, cooperative and no distress Lochia: appropriate Uterine Fundus: firm Incision: healing well DVT Evaluation: No evidence of DVT seen on physical exam.  Discharge Diagnoses: Term Pregnancy-delivered and Preelampsia  Discharge Information: Date: 02/13/2016 Activity: pelvic rest Diet: routine Medications: PNV, Ibuprofen and labetalol 100 TID Condition: stable Instructions: refer to practice specific booklet Discharge to: home Follow-up Information    Follow up with Lenoard AdenAAVON,RICHARD J, MD. Schedule an appointment as soon as possible for a visit in 1 week.   Specialty:  Obstetrics and Gynecology   Why:  Blood pressure check   Contact information:   [address] Severe[address] [place] [address] [phone]       Newborn Data: Live born female  Birth Weight: 7 lb 6.7 oz (3365 g) APGAR: 9, 9  Home with mother.  Christina MikeBAILEY, Christina Hillman 02/13/2016, 8:54 AM

## 2016-02-14 ENCOUNTER — Ambulatory Visit: Payer: Self-pay

## 2016-02-14 NOTE — Lactation Note (Signed)
This note was copied from a baby's chart. Lactation Consultation Note  Patient Name: Christina York SpanielRebecca Brideau ZOXWR'UToday's Date: 02/14/2016 Reason for consult: Follow-up assessment  Mom observed pumping; Mom assisted w/flange placement and holding. Mom also taught hand-expression. Mom was pleased that she yielded colostrum easily w/hand-expression. Although Mom reports minimal breast changes with pregnancy, she did remark that she had been leaking from her R breast prenatally.   Christina [name] was observed at the breast. She latched w/relative ease and suckled for a few minutes before switching to non-nutritive sucking only.   Paced bottle-feeding was taught in lieu of syringe/finger feeding. "Christina [name]" had her 1st bottle-feeding (other feedings charted as "bottle-feedings" were actually syringe/finger feedings) and tolerated well. Parents given volume guidelines for LPI infant, but parents understand to feed more if she desires more.  Christina [name], [name] 02/14/2016, 5:20 PM

## 2016-02-15 ENCOUNTER — Ambulatory Visit: Payer: Self-pay

## 2016-02-15 NOTE — Lactation Note (Signed)
This note was copied from a baby's chart. Lactation Consultation Note  Baby 3488 hours old and on phototherapy. Mother is pumping upon entering.  Pumped 4 ml. Demonstrated hand expression before and after pumping to help milk supply. Baby sleeping.  Suggest mother call to view latch before discharge. Plan is for mother to breastfeed baby. Then post pump w/ DEBP approx 5-6xday for 10-20 min. Give baby back volume pumped at the next feeding and the difference w/ formula. Suggest increasing volume as baby desires. Discussed supply and demand and milk storage. Reviewed engorgement care and monitoring voids/stools. Mother has a DEBP at home. Outpatient appt Tues 5/16 2:30p.         Patient Name: Christina [name] ZOXWR'UToday's Date: 02/15/2016 Reason for consult: Follow-up assessment   Maternal Data    Feeding Feeding Type: Breast Fed Length of feed: 10 min  LATCH Score/Interventions                      Lactation Tools Discussed/Used     Consult Status Consult Status: Follow-up Date: 02/22/16 Follow-up type: Out-patient    Dahlia ByesBerkelhammer, Ruth Boschen 02/15/2016, 10:10 AM

## 2016-02-22 ENCOUNTER — Inpatient Hospital Stay (HOSPITAL_COMMUNITY): Admission: RE | Admit: 2016-02-22 | Payer: BC Managed Care – PPO | Source: Ambulatory Visit

## 2016-02-22 ENCOUNTER — Ambulatory Visit (HOSPITAL_COMMUNITY)
Admission: RE | Admit: 2016-02-22 | Discharge: 2016-02-22 | Disposition: A | Discharge status: Home / Self Care | Payer: BC Managed Care – PPO | Source: Ambulatory Visit | Attending: Certified Nurse Midwife | Admitting: Certified Nurse Midwife

## 2016-02-22 NOTE — Lactation Note (Signed)
Lactation Consult Infant:Christina [name] Mother: Christina [name] League Consult:  Initial  No weight gain in 1 week.  Mother's reason for visit:  Low milk supply and help with latching Visit Type:  OP  Appointment Notes: Christina JoinerRebecca is here today for help with latching and increasing milk supply.  She has cone shaped breasts with little glandular tissue. Minimal breast changes occurred during pregnancy. She is pumping rather than BF at this point and her yield is 6-10 ml per pumping session. Discussed using an SNS to bring Christina [name] to the breast and mom was agreeable. A 445fr feeding tube and a 12 ml syringe was used.  Christina [name] was not active at the breast unless the syringe was pushed. Her intake on the first side was only 12 ml all of which was formula. She was positioned on the right breast with the same set-up and mom was involved with pushing the syringe and performing breast compressions. Transfer on the right side was 12 ml of formula plus 4 ml of Expressed BM.  Christina [name] was then bottle fed 1 oz of formula and 0.5 oz of expressed breast milk she brought with her.   Explained to mother that her breasts may not make all of the milk that Christina [name] needs but that all breast milk was important. She wants to continue working on her supply.  PLAN Suggested contacting Dr. Billy [name] to inquire about Reglan Consider Mother Love supplement with goat's rue which can help mom's with insufficient glandular tissue SNS with 12 ml at both breasts followed by offering 2 oz of supplement to increase Christina [name]'s weight Pumping and hand expression Weight  Check on Friday  Lactation Consultant:  Christina [name], Christina [name]  ________________________________________________________________________ Christina FloresBaby's Name: Christina DionesAurora Leia Anne [name] Date of Birth: 02/11/2016 Pediatrician: Christina [name] Gender: female Gestational Age: 2571w3d (At Birth) Birth Weight: 7 lb 6.7 oz (3365 g) Weight at Discharge: Weight: 7 lb (3175 g)Date of Discharge:  02/15/2016 Filed Weights   02/12/16 2330 02/13/16 2300 02/15/16 0000  Weight: 7 lb (3175 g) 6 lb 13.5 oz (3104 g) 7 lb (3175 g)   [facility] Location:Pediatrician's office Weight today: 7#2oz no weight gain in past week      ________________________________________________________________________  Mother's Name: Christina [name] Type of delivery:   Breastfeeding Experience:  P1 Maternal Medical Conditions:  Pre-eclampsia, HTN, IGT, Maternal Medications:  Labetalol, ibuprofen   ________________________________________________________________________  Breastfeeding History (Post Discharge)  Frequency of breastfeeding:  Not putting baby to the breast Duration of feeding:      Infant Intake and Output Assessment  Voids:  8 in 24 hrs.     Stools:  5 in 24 hrs.    ________________________________________________________________________  Maternal Breast Assessment  Breast:  Soft, Tubular and small amount of glandular tissue Nipple:  Erect Pain level:  0 Pain interventions:  na  _______________________________________________________________________  l

## 2016-02-29 ENCOUNTER — Ambulatory Visit (HOSPITAL_COMMUNITY)
Admission: RE | Admit: 2016-02-29 | Discharge: 2016-02-29 | Disposition: A | Discharge status: Home / Self Care | Payer: BC Managed Care – PPO | Source: Ambulatory Visit | Attending: Obstetrics and Gynecology | Admitting: Obstetrics and Gynecology

## 2016-02-29 ENCOUNTER — Ambulatory Visit (HOSPITAL_COMMUNITY): Admission: RE | Admit: 2016-02-29 | Payer: BC Managed Care – PPO | Source: Ambulatory Visit

## 2016-02-29 NOTE — Lactation Note (Signed)
Lactation Consult  Mother's reason for visit:    This is a follow up visit from last week for low milk supply.  Mother has unexplained low milk supply. She has been supplementing with the feeding tube and syringe. She has become overwhelmed with the use of the syringe. She has been breastfeeding and  has began to use a bottle for supplementing with all feedings.   Mother is taking Moringa, Goats Rue, Fenugreek and started taking Reglan on Friday. Mother is very committed to making milk. She has been working on getting a Advertising account executive for Domperidone.  Mother states that since Reglan she has noticed some changes in breast fullness.   Visit Type: feeding assessment  :  Consult:  Follow-Up Lactation Consultant:  Christina [name]  ________________________________________________________________________  Christina [name] Name: Christina [name] Date of Birth: 02/11/2016 Pediatrician:Dr Christina [name] Gender: female Gestational Age: [redacted]w[redacted]d (At Birth) Birth Weight: 7 lb 6.7 oz (3365 g) Weight at Discharge: Weight: 7 lb (3175 g)Date of Discharge: 02/15/2016 [facility] Weights   02/12/16 2330 02/13/16 2300 02/15/16 0000  Weight: 7 lb (3175 g) 6 lb 13.5 oz (3104 g) 7 lb (3175 g)   Weight today:3388, 7-7.7     Admission Information     Provider Service Admission Date    Christina Horseman, MD Newborn 02/11/2016          ADT Events       Unit Room Bed Service Event    02/11/16 1713 [facility] [id number] Newborn Admission    02/11/16 1722 [facility] 9197 [id number] Newborn Patient Update    02/11/16 1850 [facility] 9197 [id number] Newborn Transfer Out    02/11/16 1850 [facility] 9150 [id number] Newborn Transfer In    02/13/16 1709 [facility] 9150 [id number] Newborn Patient Update    02/15/16 1231 [facility] 9150 [id number] Newborn Discharge      Weight Information (last 5 days) before discharge     Date/Time   Weight   BSA (Calculated - sq m)   BMI (Calculated) Who       02/15/16 0000  7 lb (3175 g)  --  -- JL     02/13/16 2300  6 lb 13.5 oz (3104 g)  --  -- CW     02/12/16 2330  7 lb (3175 g)  --  -- CW     02/12/16 0015  7 lb 5.6 oz (3335 g)  --  -- LS     02/11/16 1713  7 lb 6.7 oz (3365 g)  0.21 sq meters  14.9 SM                                 ________________________________________________________________________  Mother's Name: Christina [name] Type of delivery:  vaginal del Breastfeeding Experience:  none Maternal Medical Conditions:  Pregnancy Induced Hypertension Maternal Medications:  Procardia, Moringa, Goats rue, fenugreek, Reglan 20 mg TID  ________________________________________________________________________  Breastfeeding History (Post Discharge)  Frequency of breastfeeding:  Every 2-3 hours Duration of feeding:  15-30 mins  Supplementation  Formula:  Volume 2-3 ouncesl Frequency:  Every 2-3 hours        Brand: Enfamil  Mother states it take 24 hours to pump 60 ml. Lots of praise given to mother.    Method:  Bottle,   Pumping  Type of pump:  Medela pump in style Frequency:  Every 2-3 hours Volume:  3-5 ml every pumping  Infant Intake and Output Assessment  Voids: -  6-8 in 24 hrs. - Color:  Clear yellow Stools: 3-6  in 24 hrs.  Color:  Brown  ________________________________________________________________________  Maternal Breast Assessment  Breast:  Filling Nipple:  Erect Pain level:  0 Pain interventions:  Bra  _______________________________________________________________________ Feeding Assessment/Evaluation:  Single SNS was use to give 60 ml of EBM. Infant took entire amt over 20 mins. Transferred  additional 6 ml from mother.  Infant latched to alternate breast and took 10 ml from mother.   A double SNS was sat up with an additional 15 ml of formula. Infant took very well.      Infant's oral assessment:  Variance  Positioning:  Cross cradle Left  breast  LATCH documentation:  Latch:  2 = Grasps breast easily, tongue down, lips flanged, rhythmical sucking.  Audible swallowing:  2 = Spontaneous and intermittent  Type of nipple:  2 = Everted at rest and after stimulation  Comfort (Breast/Nipple):  1 = Filling, red/small blisters or bruises, mild/mod discomfort  Hold (Positioning):  1 = Assistance needed to correctly position infant at breast and maintain latch  LATCH score:  8  Attached assessment:  Deep  Lips flanged:  Yes.    Lips untucked:  Yes.    Suck assessment:  Displays both   Pre-feed weight:  3388 Post-feed weight: 3394 Amount transferred:  6 ml  Infant's oral assessment:  Variance, Infant has a tight upper lip tie and cups her tongue. Has a slight posterior frenula. Infant can lift her tongue and has good lateral movement.   Positioning:  Cross cradle Right breast  LATCH documentation:  Latch:  2 = Grasps breast easily, tongue down, lips flanged, rhythmical sucking.  Audible swallowing:  2 = Spontaneous and intermittent  Type of nipple:  2 = Everted at rest and after stimulation  Comfort (Breast/Nipple):  1 = Filling, red/small blisters or bruises, mild/mod discomfort  Hold (Positioning):  1 = Assistance needed to correctly position infant at breast and maintain latch  LATCH score:  8  Attached assessment:  Deep  Lips flanged:  Yes.    Lips untucked:  Yes.    Suck assessment:  Displays both   Pre-feed weight:  3394 Post-feed weight:  3464 Amount transferred:  70ml Amount supplemented: 60 ml of ebm in SNS with and addition of 10 ml ebm from mother     Total amount transferred:  76 ml  Total supplement given: 15 ml of enfamil   Mother to continue to offer breast every 2-3 hours and with feeding cues.  Use double SNS to offer 3-4 ounces every 2-3 hours Mother to post pump for 15-20 mins 6 times daily Suggested power pumping Continue all supplements. Mother plans to get RX for Domperidone.  Follow  up on May 31 at 2;30.

## 2016-03-08 ENCOUNTER — Ambulatory Visit (HOSPITAL_COMMUNITY)
Admission: RE | Admit: 2016-03-08 | Discharge: 2016-03-08 | Disposition: A | Discharge status: Home / Self Care | Payer: BC Managed Care – PPO | Source: Ambulatory Visit | Attending: Obstetrics and Gynecology | Admitting: Obstetrics and Gynecology

## 2016-03-08 ENCOUNTER — Telehealth (HOSPITAL_COMMUNITY): Payer: Self-pay | Admitting: Lactation Services

## 2016-03-08 NOTE — Telephone Encounter (Signed)
LC CALLED THIS PATIENT BACK RO CONFIRM APPT. AT 2:30 P TODAY. MOM CONFIRMED

## 2016-03-08 NOTE — Lactation Note (Signed)
Lactation Consult  Mother's reason for visit:  F/U for low milk supply , Need help to consistently get a good latch , and sore nipples  Visit Type:  Feeding assessment  Appointment Notes:  Low milk supply , F/U , using the Double SNS  Consult:  Follow-Up Lactation Consultant:  Kathrin GreathouseIorio, Mathius Birkeland Ann  ________________________________________________________________________ Christina FloresBaby's Name: Christina [name] Date of Birth: 02/11/2016 Pediatric[facility] )  Gender: female Gestational Age: 6025w3d (At Birth) Birth Weight: 7 lb 6.7 oz (3365 g) Weight at Discharge: Weight: 7 lb (3175 g)[facility]   02/[facility]16 2330 02/13/16 2300 02/15/16 0000  Weight: 7 lb (3175 g) 6 lb 13.5 oz (3104 g) 7 lb (3175 g)   [facility]  Location: [facility] O/P appt . [facility]  Weight today: 3840 g , 8-7.5 oz    ________________________________________________________________________  Mother's Name: Christina Fullerebecca Peterson [name] Type of delivery:  Vaginal Delivery  Breastfeeding Experience: 1st baby  Maternal Medical Conditions:  B/P issues - per mom was changed from Labetalol to Procardia once a day   Maternal Medications:  Procardia once day , PNV , Reglan for low milk supply only for 10 days ( per mom will be finishing)  Per mom has noted breast feel [name] and EBM volume increasing.  Mother Love products - Fenugreek 3 capsules 3 x's a day, Goat's Rue,Moringa,  Per mom has [place] from [place] and waiting for the arrival , also aware of the dosage ( recommended by her Midwife Marlinda Mikeanya Bailey )  ________________________________________________________________________  Breastfeeding History (Post Discharge)  Frequency of breastfeeding:  Every 2-3 hours / days using the SNS,  @ night - latch w/o  SNS and supplement with a Bottle 3-4 oz  Duration of feeding: 35 - 45 mins w/SNS , and W/O  SNS - 10-15 mins   Supplement: with EBM and or formula  See above note   Pumping: DEBP Medela   Infant Intake and Output Assessment  Voids:  10-12  in 24 hrs.  Color:  Clear yellow Stools:  5-6  in 24 hrs.  Color:  Yellow  ________________________________________________________________________  Maternal Breast Assessment - mom has mentioned she was having sore nipples at home.  LC noted nipples to be normal pink, no S/S of yeast or breakdown   Breast:  Soft and Filling Nipple:  Erect Pain level:  0 Pain interventions:  Expressed breast milk  _______________________________________________________________________ Feeding Assessment/Evaluation  Initial feeding assessment:  Infant's oral assessment:  Variance - short labial frenulum above the gum line , upper lip stretches well with oral exam with glove fingers by LC  Small indentation mid portion of tongue. Baby able to left tongue above the corners of mouth. And extend over gum line. Recessed chin. High palate noted.  @ both latches both breast - upper lip stretches well and depth achieved.   Positioning:  Cross cradle Left breast  LATCH documentation:  Latch:  2 = Grasps breast easily, tongue down, lips flanged, rhythmical sucking.  Audible swallowing:  2 = Spontaneous and intermittent  Type of nipple:  2 = Everted at rest and after stimulation  Comfort (Breast/Nipple):  1 = Filling, red/small blisters or bruises, mild/mod discomfort to soft   Hold (Positioning):  1 = Assistance needed to correctly position infant at breast and maintain latch  LATCH score:  8   Attached assessment:  Shallow - @1  st   Lips flanged:  No. - LC showed mom how top flip upper lip to flanged position   Lips untucked:  Yes.    Suck assessment:  Nutritive and Nonnutritive  Baby fed for 8- 10 mins and became non - nutritive due to latch  Of flow and LC had mom release suction - weighed   Tools:  None  Instructed on use and cleaning of tool:   No.  Pre-feed weight: 3840 g , 8-7.5 oz  Post-feed weight:  3850 g , 8-7.8 oz  Amount transferred: 10 ml  Amount supplemented:  None   Additional Feeding Assessment -   Infant's oral assessment:  Variance - see above note   Positioning:  Football Right breast  LATCH documentation:  Latch:  2 = Grasps breast easily, tongue down, lips flanged, rhythmical sucking.  Audible swallowing:  2 = Spontaneous and intermittent  Type of nipple:  2 = Everted at rest and after stimulation  Comfort (Breast/Nipple):  1 = Filling, red/small blisters or bruises, mild/mod discomfort  Hold (Positioning):  1 = Assistance needed to correctly position infant at breast and maintain latch  LATCH score:  8   Attached assessment:  Deep  Lips flanged:  No.  Lips untucked:  Yes.    Suck assessment:  Nutritive and Nonnutritive  Tools:  Supplemental nutrition system Instructed on use and cleaning of tool:  Yes.    Pre-feed weight: 3850 g , 8-7.8 oz  Post-feed weight:  3888 g , 8-9.1 oz  Amount transferred:  8 ml  Amount supplemented: 30 EBM ( SNS ) @ the breast    Total amount pumped post feed: mom did not post pump   Total amount transferred:  18 ml  Total supplement given:  30 ml ( SNS) , 60 ml ( Formula from a bottle )  Total volume for this feeding : 108 ml ( baby tolerated well )   Lactation Impression: Low milk supply  Mom aware of the need to supplement and pump   Lactation Plan of care:  Praised mom for her efforts breast feeding and pumping  Feed every 2 1/2 - 3 hours  Option #1 -  Breast feed 1st breast with SNS 15-20 mins ( 3-4 oz with SNS )  Post pump  Option#2 - if not using the SNS ( per mom at night )  Supplement 1st with a bottle and then breast feed  Or if breast [name] - breastfeed both and then supplement after wards with 3-4 oz  Extra pumping - Choices - post pump 10 -15 mins both breast after 5-6 feedings a day  If busy day - after evening feeding or in place of a latch  at the breast - power pump over 60 mins - 10 mins on 10 mins off

## 2016-10-09 NOTE — L&D Delivery Note (Signed)
Delivery Note At 5:32 PM a viable and healthy female was delivered via Vaginal, Spontaneous Delivery (Presentation: OP ).  APGAR: 9, 9; weight  pending.   Placenta status: spontaneous, intact.  Cord:  with the following complications: none.  Cord pH: na  Anesthesia:  epidural Episiotomy: None Lacerations: 2nd degree;Perineal Suture Repair: 2.0 vicryl rapide Est. Blood Loss (mL): 200  Mom to postpartum.  Baby to Couplet care / Skin to Skin.  Christina [name] 08/08/2017, 5:59 PM

## 2017-01-08 LAB — OB RESULTS CONSOLE GC/CHLAMYDIA
CHLAMYDIA, DNA PROBE: NEGATIVE
GC PROBE AMP, GENITAL: NEGATIVE

## 2017-01-08 LAB — OB RESULTS CONSOLE ANTIBODY SCREEN: ANTIBODY SCREEN: NEGATIVE

## 2017-01-08 LAB — OB RESULTS CONSOLE ABO/RH: RH Type: POSITIVE

## 2017-01-08 LAB — OB RESULTS CONSOLE HIV ANTIBODY (ROUTINE TESTING): HIV: NONREACTIVE

## 2017-01-08 LAB — OB RESULTS CONSOLE RUBELLA ANTIBODY, IGM: RUBELLA: IMMUNE

## 2017-01-08 LAB — OB RESULTS CONSOLE RPR: RPR: NONREACTIVE

## 2017-01-08 LAB — OB RESULTS CONSOLE HEPATITIS B SURFACE ANTIGEN: Hepatitis B Surface Ag: NEGATIVE

## 2017-06-28 ENCOUNTER — Encounter (HOSPITAL_COMMUNITY): Payer: Self-pay | Admitting: *Deleted

## 2017-06-28 ENCOUNTER — Inpatient Hospital Stay (HOSPITAL_COMMUNITY)
Admission: AD | Admit: 2017-06-28 | Discharge: 2017-06-28 | Disposition: A | Discharge status: Home / Self Care | Payer: BC Managed Care – PPO | Source: Ambulatory Visit | Attending: Obstetrics and Gynecology | Admitting: Obstetrics and Gynecology

## 2017-06-28 DIAGNOSIS — O133 Gestational [pregnancy-induced] hypertension without significant proteinuria, third trimester: Secondary | ICD-10-CM

## 2017-06-28 DIAGNOSIS — Z3A31 31 weeks gestation of pregnancy: Secondary | ICD-10-CM | POA: Diagnosis not present

## 2017-06-28 LAB — COMPREHENSIVE METABOLIC PANEL
ALK PHOS: 74 U/L (ref 38–126)
ALT: 12 U/L — ABNORMAL LOW (ref 14–54)
ANION GAP: 9 (ref 5–15)
AST: 14 U/L — ABNORMAL LOW (ref 15–41)
Albumin: 3.3 g/dL — ABNORMAL LOW (ref 3.5–5.0)
BILIRUBIN TOTAL: 0.7 mg/dL (ref 0.3–1.2)
BUN: 6 mg/dL (ref 6–20)
CALCIUM: 8.9 mg/dL (ref 8.9–10.3)
CO2: 23 mmol/L (ref 22–32)
Chloride: 104 mmol/L (ref 101–111)
Creatinine, Ser: 0.48 mg/dL (ref 0.44–1.00)
GFR calc non Af Amer: >60 mL/min (ref >60)
Glucose, Bld: 101 mg/dL — ABNORMAL HIGH (ref 65–99)
POTASSIUM: 3.9 mmol/L (ref 3.5–5.1)
Sodium: 136 mmol/L (ref 135–145)
TOTAL PROTEIN: 7.3 g/dL (ref 6.5–8.1)

## 2017-06-28 LAB — CBC
HEMATOCRIT: 34.6 % — AB (ref 36.0–46.0)
HEMOGLOBIN: 11.7 g/dL — AB (ref 12.0–15.0)
MCH: 29.5 pg (ref 26.0–34.0)
MCHC: 33.8 g/dL (ref 30.0–36.0)
MCV: 87.2 fL (ref 78.0–100.0)
Platelets: 208 10*3/uL (ref 150–400)
RBC: 3.97 MIL/uL (ref 3.87–5.11)
RDW: 14.7 % (ref 11.5–15.5)
WBC: 8.6 10*3/uL (ref 4.0–10.5)

## 2017-06-28 LAB — PROTEIN / CREATININE RATIO, URINE
CREATININE, URINE: 138 mg/dL
Protein Creatinine Ratio: 0.09 mg/mg{Cre} (ref 0.00–0.15)
TOTAL PROTEIN, URINE: 13 mg/dL

## 2017-06-28 LAB — URIC ACID: URIC ACID, SERUM: 4.5 mg/dL (ref 2.3–6.6)

## 2017-06-28 MED ORDER — BETAMETHASONE SOD PHOS & ACET 6 (3-3) MG/ML IJ SUSP
12.0000 mg | Freq: Once | INTRAMUSCULAR | Status: AC
  Administered 2017-06-28: 12 mg via INTRAMUSCULAR
  Filled 2017-06-28: qty 2

## 2017-06-28 MED ORDER — ACETAMINOPHEN 500 MG PO TABS
1000.0000 mg | ORAL_TABLET | Freq: Once | ORAL | Status: AC
  Administered 2017-06-28: 1000 mg via ORAL
  Filled 2017-06-28: qty 2

## 2017-06-28 NOTE — MAU Note (Signed)
Pt. Sent from the office for increased BP in the office and headache 3/10. Positive for fetal movement, denies vaginal bleeding, denies sudden gush of fluid. EFM applied - FHR 150s, Toco applied - abd. Soft.

## 2017-06-28 NOTE — MAU Provider Note (Signed)
History     Chief Complaint  Patient presents with  . Headache  . Hypertension    36 yo G3P1011 MWF @ 311/7 weeks sent for [facility] evaluation due to elevated BPs in office. Pt has h/a, denies visual changes or epigastric pain. Pt has not eating since 12 n. And had not taken any tylenol for h/a. Hx preeclampsia with prior preg OB History    Gravida Para Term Preterm AB Living   0 1 1   SAB TAB Ectopic Multiple Live Births   1 0 0 0 1      Past Medical History:  Diagnosis Date  . Medical history non-contributory   . Postpartum care following vaginal delivery (5/5) 02/11/2016    Past Surgical History:  Procedure Laterality Date  . MANDIBLE SURGERY      Family History  Problem Relation Age of Onset  . Rheum arthritis Neg Hx   . Osteoarthritis Neg Hx   . Hypertension Father   . Hypertension Brother   . Hypertension Maternal Grandmother     Social History  Substance Use Topics  . Smoking status: Never Smoker  . Smokeless tobacco: Not on file  . Alcohol use No    Allergies:  Allergies  Allergen Reactions  . Erythromycin Nausea And Vomiting    Prescriptions Prior to Admission  Medication Sig Dispense Refill Last Dose  . aspirin EC 81 MG tablet Take 81 mg by mouth at bedtime.   06/27/2017 at Unknown time  . doxylamine, Sleep, (UNISOM) 25 MG tablet Take 25 mg by mouth at bedtime as needed for sleep.   06/27/2017 at Unknown time  . esomeprazole (NEXIUM) 40 MG capsule Take 40 mg by mouth daily at 12 noon.   06/28/2017 at Unknown time  . Prenatal Vit-Fe Fumarate-FA (PRENATAL MULTIVITAMIN) TABS tablet Take 1 tablet by mouth at bedtime.    06/27/2017 at Unknown time     Physical Exam   Blood pressure (!) 148/82, pulse (!) 104, resp. rate 18, SpO2 97 %, unknown if currently breastfeeding.  General appearance: alert, cooperative and no distress Heart: regular rate and rhythm, S1, S2 normal, no murmur, click, rub or gallop Abdomen: gravid soft obese Pelvic:  deferred Extremities: edema trace   Tracing: baseline 135 (+) accels 145-150 No ctx IMP: gestational HTN IUP  P) PIH labs. No indication for BP med. BMZ today and tomorrow. Tylenol 1g for h/a. May eat ED Course  Addendum: CBC    Component Value Date/Time   WBC 8.6 06/28/2017 1654   RBC 3.97 06/28/2017 1654   HGB 11.7 (L) 06/28/2017 1654   HGB 13.9 02/04/2013 1712   HCT 34.6 (L) 06/28/2017 1654   HCT 39.2 02/04/2013 1712   PLT 208 06/28/2017 1654   PLT 350 02/04/2013 1712   MCV 87.2 06/28/2017 1654   MCV 85 02/04/2013 1712   MCH 29.5 06/28/2017 1654   MCHC 33.8 06/28/2017 1654   RDW 14.7 06/28/2017 1654   RDW 12.7 02/04/2013 1712   CMP Latest Ref Rng & Units 06/28/2017 02/10/2016 02/04/2013  Glucose 65 - 99 mg/dL 161(W) 960(A) 88  BUN 6 - 20 mg/dL Creatinine 0.44 - 1.00 mg/dL 5.40 9.81 1.91  Sodium 135 - 145 mmol/L 136 138 137  Potassium 3.5 - 5.1 mmol/L 3.9 3.9 3.8  Chloride 101 - 111 mmol/L 104 109 103  CO2 22 - 32 mmol/L 23 19(L) 26  Calcium 8.9 - 10.3 mg/dL 8.9 4.7(W) 9.1  Total  Protein 6.5 - 8.1 g/dL 7.3 1.6(X) 8.0  Total Bilirubin 0.3 - 1.2 mg/dL 0.7 0.7 0.4  Alkaline Phos 38 - 126 U/L 74 86 64  AST 15 - 41 U/L 14(L) 37 18  ALT 14 - 54 U/L 12(L) 28 21  urine protein/creatinine ratio:0.09 No evidence of preeclampsia. D/c home oow until seen in office by Primary OB.  MDM   Serita Kyle, MD 5:29 PM 06/28/2017

## 2017-06-29 ENCOUNTER — Inpatient Hospital Stay (HOSPITAL_COMMUNITY)
Admission: AD | Admit: 2017-06-29 | Discharge: 2017-06-29 | Disposition: A | Discharge status: Home / Self Care | Payer: BC Managed Care – PPO | Source: Ambulatory Visit | Attending: Obstetrics and Gynecology | Admitting: Obstetrics and Gynecology

## 2017-06-29 DIAGNOSIS — Z3A31 31 weeks gestation of pregnancy: Secondary | ICD-10-CM | POA: Diagnosis not present

## 2017-06-29 DIAGNOSIS — O133 Gestational [pregnancy-induced] hypertension without significant proteinuria, third trimester: Secondary | ICD-10-CM | POA: Insufficient documentation

## 2017-06-29 MED ORDER — BETAMETHASONE SOD PHOS & ACET 6 (3-3) MG/ML IJ SUSP
12.0000 mg | Freq: Once | INTRAMUSCULAR | Status: AC
  Administered 2017-06-29: 12 mg via INTRAMUSCULAR
  Filled 2017-06-29: qty 2

## 2017-06-29 NOTE — MAU Note (Signed)
Patient here for 2nd BMZ. Denies any complaints at this time 

## 2017-07-26 LAB — OB RESULTS CONSOLE GBS: GBS: NEGATIVE

## 2017-07-30 ENCOUNTER — Other Ambulatory Visit: Payer: Self-pay | Admitting: Obstetrics and Gynecology

## 2017-08-01 ENCOUNTER — Telehealth (HOSPITAL_COMMUNITY): Payer: Self-pay | Admitting: *Deleted

## 2017-08-01 NOTE — Telephone Encounter (Signed)
Preadmission screen  

## 2017-08-08 ENCOUNTER — Inpatient Hospital Stay (HOSPITAL_COMMUNITY): Payer: BC Managed Care – PPO | Admitting: Anesthesiology

## 2017-08-08 ENCOUNTER — Inpatient Hospital Stay (HOSPITAL_COMMUNITY)
Admission: RE | Admit: 2017-08-08 | Discharge: 2017-08-10 | DRG: 807 | Disposition: A | Discharge status: Home / Self Care | Payer: BC Managed Care – PPO | Source: Ambulatory Visit | Attending: Obstetrics and Gynecology | Admitting: Obstetrics and Gynecology

## 2017-08-08 ENCOUNTER — Encounter (HOSPITAL_COMMUNITY): Payer: Self-pay

## 2017-08-08 DIAGNOSIS — Z3A37 37 weeks gestation of pregnancy: Secondary | ICD-10-CM

## 2017-08-08 DIAGNOSIS — O134 Gestational [pregnancy-induced] hypertension without significant proteinuria, complicating childbirth: Principal | ICD-10-CM | POA: Diagnosis present

## 2017-08-08 DIAGNOSIS — Z3490 Encounter for supervision of normal pregnancy, unspecified, unspecified trimester: Secondary | ICD-10-CM | POA: Diagnosis present

## 2017-08-08 DIAGNOSIS — D649 Anemia, unspecified: Secondary | ICD-10-CM | POA: Diagnosis present

## 2017-08-08 DIAGNOSIS — O9902 Anemia complicating childbirth: Secondary | ICD-10-CM | POA: Diagnosis present

## 2017-08-08 DIAGNOSIS — Z3009 Encounter for other general counseling and advice on contraception: Secondary | ICD-10-CM | POA: Diagnosis present

## 2017-08-08 HISTORY — DX: Gestational (pregnancy-induced) hypertension without significant proteinuria, unspecified trimester: O13.9

## 2017-08-08 HISTORY — DX: Gastro-esophageal reflux disease without esophagitis: K21.9

## 2017-08-08 HISTORY — DX: Anemia, unspecified: D64.9

## 2017-08-08 HISTORY — DX: Unspecified abnormal cytological findings in specimens from vagina: R87.629

## 2017-08-08 HISTORY — DX: Essential (primary) hypertension: I10

## 2017-08-08 LAB — CBC
HCT: 29.2 % — ABNORMAL LOW (ref 36.0–46.0)
HCT: 29.6 % — ABNORMAL LOW (ref 36.0–46.0)
HEMATOCRIT: 32 % — AB (ref 36.0–46.0)
Hemoglobin: 10.8 g/dL — ABNORMAL LOW (ref 12.0–15.0)
Hemoglobin: 9.9 g/dL — ABNORMAL LOW (ref 12.0–15.0)
Hemoglobin: 10.3 g/dL — ABNORMAL LOW (ref 12.0–15.0)
MCH: 29.8 pg (ref 26.0–34.0)
MCH: 29.8 pg (ref 26.0–34.0)
MCH: 30.8 pg (ref 26.0–34.0)
MCHC: 33.8 g/dL (ref 30.0–36.0)
MCHC: 33.9 g/dL (ref 30.0–36.0)
MCHC: 34.8 g/dL (ref 30.0–36.0)
MCV: 88.4 fL (ref 78.0–100.0)
MCV: 88 fL (ref 78.0–100.0)
MCV: 88.6 fL (ref 78.0–100.0)
PLATELETS: 167 10*3/uL (ref 150–400)
Platelets: 229 10*3/uL (ref 150–400)
Platelets: 179 K/uL (ref 150–400)
RBC: 3.32 MIL/uL — ABNORMAL LOW (ref 3.87–5.11)
RBC: 3.34 MIL/uL — ABNORMAL LOW (ref 3.87–5.11)
RBC: 3.62 MIL/uL — AB (ref 3.87–5.11)
RDW: 15 % (ref 11.5–15.5)
RDW: 15.1 % (ref 11.5–15.5)
RDW: 15.2 % (ref 11.5–15.5)
WBC: 10.2 10*3/uL (ref 4.0–10.5)
WBC: 8 10*3/uL (ref 4.0–10.5)
WBC: 8.9 K/uL (ref 4.0–10.5)

## 2017-08-08 LAB — COMPREHENSIVE METABOLIC PANEL WITH GFR
ALT: 11 U/L — ABNORMAL LOW (ref 14–54)
AST: 19 U/L (ref 15–41)
Albumin: 3.1 g/dL — ABNORMAL LOW (ref 3.5–5.0)
Alkaline Phosphatase: 101 U/L (ref 38–126)
Anion gap: 9 (ref 5–15)
BUN: 7 mg/dL (ref 6–20)
CO2: 19 mmol/L — ABNORMAL LOW (ref 22–32)
Calcium: 8.8 mg/dL — ABNORMAL LOW (ref 8.9–10.3)
Chloride: 110 mmol/L (ref 101–111)
Creatinine, Ser: 0.51 mg/dL (ref 0.44–1.00)
GFR calc Af Amer: >60 mL/min
GFR calc non Af Amer: >60 mL/min
Glucose, Bld: 109 mg/dL — ABNORMAL HIGH (ref 65–99)
Potassium: 3.9 mmol/L (ref 3.5–5.1)
Sodium: 138 mmol/L (ref 135–145)
Total Bilirubin: 0.2 mg/dL — ABNORMAL LOW (ref 0.3–1.2)
Total Protein: 6.5 g/dL (ref 6.5–8.1)

## 2017-08-08 LAB — TYPE AND SCREEN
ABO/RH(D): A POS
Antibody Screen: NEGATIVE

## 2017-08-08 LAB — RPR: RPR: NONREACTIVE

## 2017-08-08 MED ORDER — LACTATED RINGERS IV SOLN
500.0000 mL | Freq: Once | INTRAVENOUS | Status: AC
  Administered 2017-08-08: 500 mL via INTRAVENOUS

## 2017-08-08 MED ORDER — TERBUTALINE SULFATE 1 MG/ML IJ SOLN
0.2500 mg | Freq: Once | INTRAMUSCULAR | Status: DC | PRN
  Filled 2017-08-08: qty 1

## 2017-08-08 MED ORDER — ONDANSETRON HCL 4 MG/2ML IJ SOLN: 4.0000 mg | Freq: Four times a day (QID) | INTRAMUSCULAR | Status: DC | PRN

## 2017-08-08 MED ORDER — LABETALOL HCL 200 MG PO TABS
200.0000 mg | ORAL_TABLET | Freq: Three times a day (TID) | ORAL | Status: DC
  Administered 2017-08-09 – 2017-08-10 (×4): 200 mg via ORAL
  Filled 2017-08-08 (×5): qty 1

## 2017-08-08 MED ORDER — SOD CITRATE-CITRIC ACID 500-334 MG/5ML PO SOLN: 30.0000 mL | ORAL | Status: DC | PRN

## 2017-08-08 MED ORDER — PHENYLEPHRINE 40 MCG/ML (10ML) SYRINGE FOR IV PUSH (FOR BLOOD PRESSURE SUPPORT)
80.0000 ug | PREFILLED_SYRINGE | INTRAVENOUS | Status: DC | PRN
  Filled 2017-08-08: qty 5

## 2017-08-08 MED ORDER — ACETAMINOPHEN 325 MG PO TABS: 650.0000 mg | ORAL_TABLET | ORAL | Status: DC | PRN

## 2017-08-08 MED ORDER — ONDANSETRON HCL 4 MG PO TABS: 4.0000 mg | ORAL_TABLET | ORAL | Status: DC | PRN

## 2017-08-08 MED ORDER — FENTANYL 2.5 MCG/ML BUPIVACAINE 1/10 % EPIDURAL INFUSION (WH - ANES)
14.0000 mL/h | INTRAMUSCULAR | Status: DC | PRN
  Administered 2017-08-08 (×2): 14 mL/h via EPIDURAL
  Filled 2017-08-08: qty 100

## 2017-08-08 MED ORDER — LACTATED RINGERS IV SOLN: 500.0000 mL | Freq: Once | INTRAVENOUS | Status: DC

## 2017-08-08 MED ORDER — PHENYLEPHRINE 40 MCG/ML (10ML) SYRINGE FOR IV PUSH (FOR BLOOD PRESSURE SUPPORT)
80.0000 ug | PREFILLED_SYRINGE | INTRAVENOUS | Status: DC | PRN
  Filled 2017-08-08: qty 5
  Filled 2017-08-08: qty 10

## 2017-08-08 MED ORDER — ZOLPIDEM TARTRATE 5 MG PO TABS: 5.0000 mg | ORAL_TABLET | Freq: Every evening | ORAL | Status: DC | PRN

## 2017-08-08 MED ORDER — ACETAMINOPHEN 325 MG PO TABS
650.0000 mg | ORAL_TABLET | ORAL | Status: DC | PRN
  Administered 2017-08-09: 650 mg via ORAL
  Filled 2017-08-08: qty 2

## 2017-08-08 MED ORDER — ZOLPIDEM TARTRATE 5 MG PO TABS
5.0000 mg | ORAL_TABLET | Freq: Every evening | ORAL | Status: DC | PRN
  Administered 2017-08-08: 5 mg via ORAL
  Filled 2017-08-08: qty 1

## 2017-08-08 MED ORDER — OXYCODONE-ACETAMINOPHEN 5-325 MG PO TABS: 1.0000 | ORAL_TABLET | ORAL | Status: DC | PRN

## 2017-08-08 MED ORDER — OXYTOCIN 40 UNITS IN LACTATED RINGERS INFUSION - SIMPLE MED
1.0000 m[IU]/min | INTRAVENOUS | Status: DC
  Administered 2017-08-08: 2 m[IU]/min via INTRAVENOUS

## 2017-08-08 MED ORDER — PRENATAL MULTIVITAMIN CH
1.0000 | ORAL_TABLET | Freq: Every day | ORAL | Status: DC
  Administered 2017-08-09 – 2017-08-10 (×2): 1 via ORAL
  Filled 2017-08-08 (×2): qty 1

## 2017-08-08 MED ORDER — LIDOCAINE HCL (PF) 1 % IJ SOLN
INTRAMUSCULAR | Status: AC
  Filled 2017-08-08: qty 30

## 2017-08-08 MED ORDER — EPHEDRINE 5 MG/ML INJ
10.0000 mg | INTRAVENOUS | Status: DC | PRN
  Filled 2017-08-08: qty 2

## 2017-08-08 MED ORDER — LACTATED RINGERS IV SOLN
INTRAVENOUS | Status: DC
  Administered 2017-08-08: 01:00:00 via INTRAVENOUS

## 2017-08-08 MED ORDER — OXYCODONE-ACETAMINOPHEN 5-325 MG PO TABS: 2.0000 | ORAL_TABLET | ORAL | Status: DC | PRN

## 2017-08-08 MED ORDER — IBUPROFEN 600 MG PO TABS
600.0000 mg | ORAL_TABLET | Freq: Four times a day (QID) | ORAL | Status: DC
  Administered 2017-08-08 – 2017-08-10 (×7): 600 mg via ORAL
  Filled 2017-08-08 (×7): qty 1

## 2017-08-08 MED ORDER — METHYLERGONOVINE MALEATE 0.2 MG PO TABS: 0.2000 mg | ORAL_TABLET | ORAL | Status: DC | PRN

## 2017-08-08 MED ORDER — ONDANSETRON HCL 4 MG/2ML IJ SOLN: 4.0000 mg | INTRAMUSCULAR | Status: DC | PRN

## 2017-08-08 MED ORDER — OXYTOCIN 40 UNITS IN LACTATED RINGERS INFUSION - SIMPLE MED
1.0000 m[IU]/min | INTRAVENOUS | Status: DC
  Administered 2017-08-08: 20 m[IU]/min via INTRAVENOUS
  Filled 2017-08-08: qty 1000

## 2017-08-08 MED ORDER — BENZOCAINE-MENTHOL 20-0.5 % EX AERO: 1.0000 "application " | INHALATION_SPRAY | CUTANEOUS | Status: DC | PRN

## 2017-08-08 MED ORDER — MISOPROSTOL 25 MCG QUARTER TABLET
25.0000 ug | ORAL_TABLET | ORAL | Status: DC | PRN
  Administered 2017-08-08: 25 ug via VAGINAL
  Filled 2017-08-08 (×2): qty 1

## 2017-08-08 MED ORDER — SENNOSIDES-DOCUSATE SODIUM 8.6-50 MG PO TABS
2.0000 | ORAL_TABLET | ORAL | Status: DC
  Administered 2017-08-09 (×2): 2 via ORAL
  Filled 2017-08-08 (×2): qty 2

## 2017-08-08 MED ORDER — SIMETHICONE 80 MG PO CHEW: 80.0000 mg | CHEWABLE_TABLET | ORAL | Status: DC | PRN

## 2017-08-08 MED ORDER — DIPHENHYDRAMINE HCL 50 MG/ML IJ SOLN: 12.5000 mg | INTRAMUSCULAR | Status: DC | PRN

## 2017-08-08 MED ORDER — LABETALOL HCL 200 MG PO TABS
200.0000 mg | ORAL_TABLET | Freq: Three times a day (TID) | ORAL | Status: DC
  Administered 2017-08-08 (×2): 200 mg via ORAL
  Filled 2017-08-08 (×2): qty 1

## 2017-08-08 MED ORDER — COCONUT OIL OIL: 1.0000 "application " | TOPICAL_OIL | Status: DC | PRN

## 2017-08-08 MED ORDER — METHYLERGONOVINE MALEATE 0.2 MG/ML IJ SOLN: 0.2000 mg | INTRAMUSCULAR | Status: DC | PRN

## 2017-08-08 MED ORDER — LIDOCAINE HCL (PF) 1 % IJ SOLN
INTRAMUSCULAR | Status: DC | PRN
  Administered 2017-08-08 (×2): 4 mL via EPIDURAL

## 2017-08-08 MED ORDER — WITCH HAZEL-GLYCERIN EX PADS: 1.0000 "application " | MEDICATED_PAD | CUTANEOUS | Status: DC | PRN

## 2017-08-08 MED ORDER — LACTATED RINGERS IV SOLN: 500.0000 mL | INTRAVENOUS | Status: DC | PRN

## 2017-08-08 MED ORDER — TETANUS-DIPHTH-ACELL PERTUSSIS 5-2.5-18.5 LF-MCG/0.5 IM SUSP: 0.5000 mL | Freq: Once | INTRAMUSCULAR | Status: DC

## 2017-08-08 MED ORDER — DIBUCAINE 1 % RE OINT
1.0000 | TOPICAL_OINTMENT | RECTAL | Status: DC | PRN
Start: 2017-08-08 — End: 2017-08-10

## 2017-08-08 MED ORDER — DIPHENHYDRAMINE HCL 25 MG PO CAPS: 25.0000 mg | ORAL_CAPSULE | Freq: Four times a day (QID) | ORAL | Status: DC | PRN

## 2017-08-08 NOTE — H&P (Signed)
Christina KingfisherRebecca Peterson Hagger is a 36 y.o. female presenting for IOL due to labile HTN. OB History    Gravida Para Term Preterm AB Living   3 1 1  0 1 1   SAB TAB Ectopic Multiple Live Births   1 0 0 0 1     Past Medical History:  Diagnosis Date  . Anemia   . GERD (gastroesophageal reflux disease)   . Hypertension   . Medical history non-contributory   . Postpartum care following vaginal delivery (5/5) 02/11/2016  . Pregnancy induced hypertension   . Preterm labor   . Vaginal Pap smear, abnormal    Past Surgical History:  Procedure Laterality Date  . MANDIBLE SURGERY     Family History: family history includes Hypertension in her brother, father, and maternal grandmother. Social History:  reports that she has never smoked. She has never used smokeless tobacco. She reports that she does not drink alcohol or use drugs.     Maternal Diabetes: No Genetic Screening: Normal Maternal Ultrasounds/Referrals: Normal Fetal Ultrasounds or other Referrals:  None Maternal Substance Abuse:  No Significant Maternal Medications:  None Significant Maternal Lab Results:  None Other Comments:  None  Review of Systems  Constitutional: Negative.   All other systems reviewed and are negative.  Maternal Medical History:  Fetal activity: Perceived fetal activity is normal.   Last perceived fetal movement was within the past hour.    Prenatal complications: PIH.   Prenatal Complications - Diabetes: none.    Dilation: Fingertip Exam by:: Dr. Billy Coastaavon Blood pressure 116/77, pulse 93, temperature 97.7 F (36.5 C), temperature source Oral, resp. rate 19, height 5\' 7"  (1.702 m), weight 108.4 kg (239 lb), unknown if currently breastfeeding. Maternal Exam:  Uterine Assessment: Contraction strength is mild.  Contraction frequency is rare.   Abdomen: Patient reports no abdominal tenderness. Fetal presentation: vertex  Introitus: Normal vulva. Normal vagina.  Ferning test: not done.  Nitrazine test: not  done. Amniotic fluid character: not assessed.  Pelvis: adequate for delivery.   Cervix: Cervix evaluated by digital exam.     Physical Exam  Nursing note and vitals reviewed. Constitutional: She is oriented to person, place, and time. She appears well-developed and well-nourished.  HENT:  Head: Normocephalic and atraumatic.  Neck: Normal range of motion. Neck supple.  Cardiovascular: Normal rate and regular rhythm.   Respiratory: Effort normal and breath sounds normal.  GI: Soft. Bowel sounds are normal.  Genitourinary: Vagina normal and uterus normal.  Musculoskeletal: Normal range of motion.  Neurological: She is alert and oriented to person, place, and time. She has normal reflexes.  Skin: Skin is warm and dry.  Psychiatric: She has a normal mood and affect.    Prenatal labs: ABO, Rh: --/--/A POS (10/31 0055) Antibody: NEG (10/31 0055) Rubella: Immune (04/02 0000) RPR: Nonreactive (04/02 0000)  HBsAg: Negative (04/02 0000)  HIV: Non-reactive (04/02 0000)  GBS: Negative (10/18 0000)   Assessment/Plan: Labile HTN at term- ? CHTN vs Gestational HTN - early onset Admit for IOL   Jenna Routzahn J 08/08/2017, 6:15 AM

## 2017-08-08 NOTE — Progress Notes (Signed)
Monitors removed for procedure

## 2017-08-08 NOTE — Progress Notes (Signed)
Christina KingfisherRebecca Peterson [name] is a 36 y.o. G3P1011 at 5453w0d by LMP admitted for induction of labor due to Hypertension.  Subjective: Feeling more contractions No headache or visual changes  Objective: BP 135/89   Pulse 92   Temp 98 F (36.7 C) (Tympanic)   Resp 16   Ht 5\' 7"  (1.702 m)   Wt 108.4 kg (239 lb)   BMI 37.43 kg/m  No intake/output data recorded. No intake/output data recorded.  FHT:  FHR: 145 bpm, variability: moderate,  accelerations:  Present,  decelerations:  Absent UC:   irregular, every 2-6 minutes SVE:   Dilation: 2.5 Exam by:: Dr. Billy Coastaavon  AROM- clear  Labs: Lab Results  Component Value Date   WBC 8.9 08/08/2017   HGB 9.9 (L) 08/08/2017   HCT 29.2 (L) 08/08/2017   MCV 88.0 08/08/2017   PLT 179 08/08/2017    Assessment / Plan: Induction of labor due to gestational hypertension,  progressing well on pitocin  Labor: Progressing normally Preeclampsia:  no signs or symptoms of toxicity, intake and ouput balanced and labs stable Fetal Wellbeing:  Category I Pain Control:  Labor support without medications I/D:  n/a Anticipated MOD:  NSVD  Richy Spradley J 08/08/2017, 9:37 AM

## 2017-08-08 NOTE — Anesthesia Procedure Notes (Signed)
Epidural Patient location during procedure: OB Start time: 08/08/2017 12:55 PM  Staffing Anesthesiologist: Mal AmabileFOSTER, Fidencio Duddy Performed: anesthesiologist   Preanesthetic Checklist Completed: patient identified, site marked, surgical consent, pre-op evaluation, timeout performed, IV checked, risks and benefits discussed and monitors and equipment checked  Epidural Patient position: sitting Prep: site prepped and draped and DuraPrep Patient monitoring: continuous pulse ox and blood pressure Approach: midline Location: L3-L4 Injection technique: LOR air  Needle:  Needle type: Tuohy  Needle gauge: 17 G Needle length: 9 cm and 9 Needle insertion depth: 7 cm Catheter type: closed end flexible Catheter size: 19 Gauge Catheter at skin depth: 10 cm Test dose: negative and Other  Assessment Events: blood not aspirated, injection not painful, no injection resistance, negative IV test and no paresthesia  Additional Notes Patient identified. Risks and benefits discussed including failed block, incomplete  Pain control, post dural puncture headache, nerve damage, paralysis, blood pressure Changes, nausea, vomiting, reactions to medications-both toxic and allergic and post Partum back pain. All questions were answered. Patient expressed understanding and wished to proceed. Sterile technique was used throughout procedure. Epidural site was Dressed with sterile barrier dressing. No paresthesias, signs of intravascular injection Or signs of intrathecal spread were encountered.  Patient was more comfortable after the epidural was dosed. Please see RN's note for documentation of vital signs and FHR which are stable.

## 2017-08-08 NOTE — Anesthesia Preprocedure Evaluation (Signed)
Anesthesia Evaluation  Patient identified by MRN, date of birth, ID band Patient awake    Reviewed: Allergy & Precautions, Patient's Chart, lab work & pertinent test results  Airway Mallampati: III  TM Distance: >3 FB Neck ROM: Full    Dental no notable dental hx. (+) Teeth Intact   Pulmonary neg pulmonary ROS,    Pulmonary exam normal breath sounds clear to auscultation       Cardiovascular hypertension, Pt. on medications and Pt. on home beta blockers Normal cardiovascular exam Rhythm:Regular Rate:Normal     Neuro/Psych negative neurological ROS  negative psych ROS   GI/Hepatic Neg liver ROS, Medicated and Controlled,  Endo/Other  Obesity  Renal/GU negative Renal ROS  negative genitourinary   Musculoskeletal negative musculoskeletal ROS (+)   Abdominal (+) + obese,   Peds  Hematology  (+) anemia ,   Anesthesia Other Findings   Reproductive/Obstetrics (+) Pregnancy Gestational HTN AMA                             Anesthesia Physical Anesthesia Plan  ASA: III  Anesthesia Plan: Epidural   Post-op Pain Management:    Induction:   PONV Risk Score and Plan:   Airway Management Planned: Natural Airway  Additional Equipment:   Intra-op Plan:   Post-operative Plan:   Informed Consent: I have reviewed the patients History and Physical, chart, labs and discussed the procedure including the risks, benefits and alternatives for the proposed anesthesia with the patient or authorized representative who has indicated his/her understanding and acceptance.     Plan Discussed with: Anesthesiologist  Anesthesia Plan Comments:         Anesthesia Quick Evaluation

## 2017-08-08 NOTE — Anesthesia Pain Management Evaluation Note (Signed)
  CRNA Pain Management Visit Note  Patient: Christina FullerRebecca Peterson Dhami, 36 y.o., female  "[facility] We have an anesthesia team available at all times to provide care throughout the hospital, including epidural management and anesthesia for C-section. I don't know your plan for the delivery whether it a natural birth, water birth, IV sedation, nitrous supplementation, doula or epidural, but we want to meet your pain goals."   1.Was your pain managed to your expectations on prior hospitalizations?   Yes   2.What is your expectation for pain management during this hospitalization?     Epidural  3.How can we help you reach that goal?   Record the patient's initial score and the patient's pain goal.   Pain: 2  Pain Goal: 5 [facility] wants you to be able to say your pain was always managed very well.  Rashaun Wichert Hristova 08/08/2017

## 2017-08-09 LAB — CBC
HCT: 33.2 % — ABNORMAL LOW (ref 36.0–46.0)
HEMOGLOBIN: 11.5 g/dL — AB (ref 12.0–15.0)
MCH: 30.2 pg (ref 26.0–34.0)
MCHC: 34.6 g/dL (ref 30.0–36.0)
MCV: 87.1 fL (ref 78.0–100.0)
Platelets: 217 10*3/uL (ref 150–400)
RBC: 3.81 MIL/uL — ABNORMAL LOW (ref 3.87–5.11)
RDW: 15.2 % (ref 11.5–15.5)
WBC: 12.1 10*3/uL — ABNORMAL HIGH (ref 4.0–10.5)

## 2017-08-09 MED ORDER — OXYCODONE-ACETAMINOPHEN 5-325 MG PO TABS: 1.0000 | ORAL_TABLET | Freq: Four times a day (QID) | ORAL | Status: DC | PRN

## 2017-08-09 NOTE — Anesthesia Postprocedure Evaluation (Signed)
Anesthesia Post Note  Patient: Christina FullerRebecca Peterson [name]  Procedure(s) Performed: AN AD HOC LABOR EPIDURAL     Patient location during evaluation: Mother Baby Anesthesia Type: Epidural Level of consciousness: awake Pain management: pain level controlled Vital Signs Assessment: post-procedure vital signs reviewed and stable Respiratory status: spontaneous breathing Cardiovascular status: blood pressure returned to baseline Postop Assessment: epidural receding, patient able to bend at knees and no headache Anesthetic complications: no    Last Vitals:  Vitals:   08/09/17 0230 08/09/17 0558  BP: 105/62 123/79  Pulse: 73 79  Resp: 18 20  Temp: 36.7 C 36.8 C    Last Pain:  Vitals:   08/09/17 0558  TempSrc: Axillary  PainSc: 4    Pain Goal:                 Edison PaceWILKERSON,Shela Esses

## 2017-08-09 NOTE — Lactation Note (Signed)
This note was copied from a baby's chart. Lactation Consultation Note Mom sleeping soundly w/eye mask on. Patient Name: Christina [name] ZOXWR'UToday's Date: 08/09/2017     Maternal Data    Feeding    LATCH Score                   Interventions    Lactation Tools Discussed/Used     Consult Status      [name], Roxi Hlavaty G 08/09/2017, 4:33 AM

## 2017-08-09 NOTE — Progress Notes (Signed)
This AM, pt's blood pressure was 123/79. Contacted pharmacy to inquire whether to give labatelol this morning due to this and previous pressures. Advised to hold the labatelol at this time and recheck two hours later.

## 2017-08-09 NOTE — Progress Notes (Signed)
Post Partum Day 1. [id number]. IOL for labile HTN. SVD, 2nd degr lac, GIRL, 5.32 pm.   Subjective: C/o perineal pain not controlled well with Motrin, wants stronger pain med.  Ambulating, lochia moderate. No breast complaints. Needed extended stay with 1st baby for jaundice.  Baby is breat and formula fed by pt's choice.  Denies PEC s/s.  Objective: Blood pressure 118/72, pulse 78, temperature 98.2 F (36.8 C), temperature source Axillary, resp. rate 20, height 5\' 7"  (1.702 m), weight 239 lb (108.4 kg), unknown if currently breastfeeding.  GHTN since 6 wks, BPs stable on Labetalol 200mg  q8hrs.   Temp:  [97.5 F (36.4 C)-98.5 F (36.9 C)] 98.2 F (36.8 C) (11/01 0558) Pulse Rate:  [73-98] 78 (11/01 0820) Resp:  [14-20] 20 (11/01 0558) BP: (104-156)/(56-96) 118/72 (11/01 0820)   Physical Exam:  General: alert and cooperative Lochia: appropriate Uterine Fundus: firm Incision: perineal, not checked today DVT Evaluation: No evidence of DVT seen on physical exam.  CBC Latest Ref Rng & Units 08/09/2017 08/08/2017 08/08/2017  WBC 4.0 - 10.5 K/uL 12.1(H) 8.0 8.9  Hemoglobin 12.0 - 15.0 g/dL 11.5(L) 10.3(L) 9.9(L)  Hematocrit 36.0 - 46.0 % 33.2(L) 29.6(L) 29.2(L)  Platelets 150 - 400 K/uL 217 167 179   CMP Latest Ref Rng & Units 08/08/2017 06/28/2017   Glucose 65 - 99 mg/dL 161(W109(H) 960(A101(H)   BUN 6 - 20 mg/dL 7 6   Creatinine 5.400.44 - 1.00 mg/dL 9.810.51 1.910.48   Sodium 478135 - 145 mmol/L 138 136   Potassium 3.5 - 5.1 mmol/L 3.9 3.9   Chloride 101 - 111 mmol/L 110 104   CO2 22 - 32 mmol/L 19(L) 23   Calcium 8.9 - 10.3 mg/dL 2.9(F8.8(L) 8.9   Total Protein 6.5 - 8.1 g/dL 6.5 7.3   Total Bilirubin 0.3 - 1.2 mg/dL 6.2(Z0.2(L) 0.7   Alkaline Phos 38 - 126 U/L 101 74   AST 15 - 41 U/L 19 14(L)   ALT 14 - 54 U/L 11(L) 12(L)    A(+) Rub Imm   Assessment/Plan: Plan for discharge tomorrow and Breastfeeding and formula feeding.  Percocet for perineal pain for few days reviewed.  No depression hx.  GHTN-  stable, continue Labetalol 200mg  tid, home BPs, taper to 200mg  q 12hrs after 4 days. Call if symptomatic with low BPs/ dizzy.     LOS: 1 day   Christina [name] R 08/09/2017, 8:27 AM

## 2017-08-09 NOTE — Progress Notes (Signed)
resting

## 2017-08-09 NOTE — Progress Notes (Signed)
MD called regarding BP and asked whether to give scheduled Labetalol.  MD is currently rounding and will evaluate patient.  No new orders at this time.

## 2017-08-10 MED ORDER — OXYCODONE-ACETAMINOPHEN 5-325 MG PO TABS: 1.0000 | ORAL_TABLET | ORAL | 0 refills | Status: DC | PRN

## 2017-08-10 MED ORDER — IBUPROFEN 600 MG PO TABS: 600.0000 mg | ORAL_TABLET | Freq: Four times a day (QID) | ORAL | 0 refills | Status: DC

## 2017-08-10 NOTE — Discharge Summary (Signed)
OB Discharge Summary  Patient Name: Christina [name] DOB: [birth year] MRN: [id number]  Date of admission: 08/08/2017 Delivering MD: Olivia Mackie   Date of discharge: 08/10/2017  Admitting diagnosis: INDUCTION Intrauterine pregnancy: [redacted]w[redacted]d     Secondary diagnosis:Principal Problem:   Postpartum care following vaginal delivery (10/31) Active Problems:   Encounter for planned induction of labor   SVD (spontaneous vaginal delivery)   Second degree perineal laceration  Additional problems:gestational htn     Discharge diagnosis: Term Pregnancy Delivered and Gestational Hypertension                                                                     Post partum procedures:none  Augmentation: Pitocin  Complications: None  Hospital course:  Induction of Labor With Vaginal Delivery   36 y.o. yo [id number] at [redacted]w[redacted]d was admitted to the hospital 08/08/2017 for induction of labor.  Indication for induction: Gestational hypertension.  Patient had an uncomplicated labor course as follows: Membrane Rupture Time/Date: 9:36 AM ,08/08/2017   Intrapartum Procedures: Episiotomy: None [1]                                         Lacerations:  2nd degree [3];Perineal [11]  Patient had delivery of a Viable infant.  Information for the patient's newborn:  Milaina, Sher Girl Michaella [[id number]]  Delivery Method: Vaginal, Spontaneous Delivery (Filed from Delivery Summary)   08/08/2017  Details of delivery can be found in separate delivery note.  Patient had a routine postpartum course. Patient is discharged home 08/10/17.  On PPD # 2 pt notes no HA, no vision change,  No RUQ pain, no CP/ SOB. Ambulating and voiding w/o difficulty. Some perineal pain but not needing narcotics.   Physical exam  Vitals:   08/09/17 0820 08/09/17 1758 08/09/17 2127 08/10/17 0557  BP: 118/72 134/88 128/86 133/77  Pulse: 78 90 79 79  Resp:  18  18  Temp:  97.9 F (36.6 C)  98 F (36.7 C)  TempSrc:  Oral  Oral   SpO2:  98%    Weight:      Height:       General: alert and cooperative Lochia: appropriate Uterine Fundus: firm Incision: N/A DVT Evaluation: No evidence of DVT seen on physical exam. Labs: Lab Results  Component Value Date   WBC 12.1 (H) 08/09/2017   HGB 11.5 (L) 08/09/2017   HCT 33.2 (L) 08/09/2017   MCV 87.1 08/09/2017   PLT 217 08/09/2017   CMP Latest Ref Rng & Units 08/08/2017  Glucose 65 - 99 mg/dL 130(Q)  BUN 6 - 20 mg/dL 7  Creatinine 6.57 - 8.46 mg/dL 9.62  Sodium 952 - 841 mmol/L 138  Potassium 3.5 - 5.1 mmol/L 3.9  Chloride 101 - 111 mmol/L 110  CO2 22 - 32 mmol/L 19(L)  Calcium 8.9 - 10.3 mg/dL 3.2(G)  Total Protein 6.5 - 8.1 g/dL 6.5  Total Bilirubin 0.3 - 1.2 mg/dL 4.0(N)  Alkaline Phos 38 - 126 U/L 101  AST 15 - 41 U/L 19  ALT 14 - 54 U/L 11(L)    Discharge instruction: per After Visit Summary and "  Baby and Me Booklet".  After Visit Meds:  Allergies as of 08/10/2017      Reactions   Erythromycin Nausea And Vomiting      Medication List    STOP taking these medications   aspirin EC 81 MG tablet     TAKE these medications   cetirizine 10 MG tablet Commonly known as:  ZYRTEC Take 10 mg by mouth daily as needed for allergies.   doxylamine (Sleep) 25 MG tablet Commonly known as:  UNISOM Take 25 mg by mouth at bedtime as needed for sleep.   esomeprazole 40 MG capsule Commonly known as:  NEXIUM Take 40 mg by mouth daily at 12 noon.   ibuprofen 600 MG tablet Commonly known as:  ADVIL,MOTRIN Take 1 tablet (600 mg total) by mouth every 6 (six) hours.   labetalol 100 MG tablet Commonly known as:  NORMODYNE Take 200 mg by mouth every 8 (eight) hours.   oxyCODONE-acetaminophen 5-325 MG tablet Commonly known as:  PERCOCET/ROXICET Take 1-2 tablets by mouth every 4 (four) hours as needed (pain scale > 7).   prenatal multivitamin Tabs tablet Take 1 tablet by mouth at bedtime.       Diet: routine diet  Activity: Advance as tolerated.  Pelvic rest for 6 weeks.   Outpatient follow up:and  1 wk for bp check Follow up Appt:No future appointments. Follow up visit: No Follow-up on file.  Postpartum contraception: Not Discussed  Newborn Data: Live born female  Birth Weight: 6 lb 1.4 oz (2760 g) APGAR: 9, 9  Newborn Delivery   Birth date/time:  08/08/2017 17:32:00 Delivery type:  Vaginal, Spontaneous Delivery      Baby Feeding: Bottle Disposition:home with mother   08/10/2017 Lendon ColonelFOGLEMAN,Maleiyah Releford A., MD

## 2017-09-18 LAB — HM PAP SMEAR: HM Pap smear: NEGATIVE

## 2018-10-17 ENCOUNTER — Encounter: Payer: Self-pay | Admitting: Physician Assistant

## 2018-10-17 ENCOUNTER — Ambulatory Visit (INDEPENDENT_AMBULATORY_CARE_PROVIDER_SITE_OTHER): Payer: BC Managed Care – PPO | Admitting: Physician Assistant

## 2018-10-17 VITALS — BP 138/90 | HR 90 | Temp 98.2°F | Resp 16 | Ht 67.0 in | Wt 236.2 lb

## 2018-10-17 DIAGNOSIS — Z130 Encounter for screening for diseases of the blood and blood-forming organs and certain disorders involving the immune mechanism: Secondary | ICD-10-CM

## 2018-10-17 DIAGNOSIS — Z1329 Encounter for screening for other suspected endocrine disorder: Secondary | ICD-10-CM

## 2018-10-17 DIAGNOSIS — I10 Essential (primary) hypertension: Secondary | ICD-10-CM | POA: Diagnosis not present

## 2018-10-17 DIAGNOSIS — E785 Hyperlipidemia, unspecified: Secondary | ICD-10-CM | POA: Insufficient documentation

## 2018-10-17 DIAGNOSIS — Z0000 Encounter for general adult medical examination without abnormal findings: Secondary | ICD-10-CM | POA: Diagnosis not present

## 2018-10-17 DIAGNOSIS — Z8249 Family history of ischemic heart disease and other diseases of the circulatory system: Secondary | ICD-10-CM

## 2018-10-17 DIAGNOSIS — Z131 Encounter for screening for diabetes mellitus: Secondary | ICD-10-CM | POA: Diagnosis not present

## 2018-10-17 DIAGNOSIS — Z114 Encounter for screening for human immunodeficiency virus [HIV]: Secondary | ICD-10-CM

## 2018-10-17 DIAGNOSIS — Z13220 Encounter for screening for lipoid disorders: Secondary | ICD-10-CM | POA: Diagnosis not present

## 2018-10-17 MED ORDER — LABETALOL HCL 100 MG PO TABS: 100.0000 mg | ORAL_TABLET | Freq: Two times a day (BID) | ORAL | 0 refills | Status: DC

## 2018-10-17 NOTE — Progress Notes (Signed)
Patient: Christina [name], Female    DOB: Feb 06, 1981, 38 y.o.   MRN: [id number] Visit Date: 10/24/2018  Today's Provider: Trey Sailors, PA-C   Chief Complaint  Patient presents with  . New Patient (Initial Visit)   Subjective:    Annual physical exam Christina [name] is a 38 y.o. female who presents today for health maintenance and complete physical. She feels well. She reports exercising no. She reports she is sleeping well. 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 Has a 34 year old child.  Pap-last year, at [facility] Tdap-2018 [facility] IUD for contraception   Patient reports she had gestation HTN and was treated with labetalol. After delivery, she stopped the labetalol but continued to track her blood pressures which she brings with her today. Majority of readings are 140's/90s. Denies chest pain, SOB, headaches.  Patient is requesting to have cholesterol checked, pt reports that she took cholesterol reducing medication about 6-7 years ago.   BP Readings from Last 3 Encounters:  10/17/18 138/90  08/10/17 133/77  06/28/17 123/79    -----------------------------------------------------------------   Review of Systems  Constitutional: Negative.   HENT: Negative.   Eyes: Negative.   Respiratory: Negative.   Cardiovascular: Negative.   Gastrointestinal: Negative.   Endocrine: Negative.   Genitourinary: Negative.   Musculoskeletal: Positive for back pain and neck pain.  Skin: Negative.   Allergic/Immunologic: Negative.   Neurological: Negative.   Hematological: Negative.   Psychiatric/Behavioral: The patient is nervous/anxious.     Social History      She  reports that she has never smoked. She has never used smokeless tobacco. She reports that she does not drink alcohol or use drugs.       Social History   Socioeconomic History  . Marital status: Married    Spouse name: Not on file  . Number of children: Not on file  .  Years of education: Not on file  . Highest education level: Not on file  Occupational History  . Not on file  Social Needs  . Financial resource strain: Not on file  . Food insecurity:    Worry: Not on file    Inability: Not on file  . Transportation needs:    Medical: Not on file    Non-medical: Not on file  Tobacco Use  . Smoking status: Never Smoker  . Smokeless tobacco: Never Used  Substance and Sexual Activity  . Alcohol use: No  . Drug use: No  . Sexual activity: Yes  Lifestyle  . Physical activity:    Days per week: Not on file    Minutes per session: Not on file  . Stress: Not on file  Relationships  . Social connections:    Talks on phone: Not on file    Gets together: Not on file    Attends religious service: Not on file    Active member of club or organization: Not on file    Attends meetings of clubs or organizations: Not on file    Relationship status: Not on file  Other Topics Concern  . Not on file  Social History Narrative  . Not on file    Past Medical History:  Diagnosis Date  . Allergy   . Anemia   . Anxiety   . GERD (gastroesophageal reflux disease)   . Hyperlipidemia   . Hypertension   . Medical history non-contributory   . Postpartum care following vaginal delivery (5/5) 02/11/2016  .  Pregnancy induced hypertension   . Preterm labor   . Vaginal Pap smear, abnormal      Patient Active Problem List   Diagnosis Date Noted  . HLD (hyperlipidemia) 10/17/2018  . HTN (hypertension) 10/17/2018  . SVD (spontaneous vaginal delivery) 08/09/2017  . Postpartum care following vaginal delivery (10/31) 08/09/2017  . Second degree perineal laceration 08/09/2017  . Encounter for planned induction of labor 08/08/2017  . Preeclampsia 02/10/2016    Past Surgical History:  Procedure Laterality Date  . MANDIBLE SURGERY      Family History        Family Status  Relation Name Status  . Mother  Alive  . Father  Alive  . Brother  Alive  . MGM   (Not Specified)  . MGF  (Not Specified)  . PGM  (Not Specified)  . Neg Hx  (Not Specified)        Her family history includes Anemia in her father; Cataracts in her mother; Colon cancer in her maternal grandfather; Dementia in her maternal grandmother; Hyperlipidemia in her brother, father, maternal grandmother, mother, and paternal grandmother; Hypertension in her brother, father, maternal grandmother, mother, and paternal grandmother. There is no history of Rheum arthritis or Osteoarthritis.      Allergies  Allergen Reactions  . Erythromycin Nausea And Vomiting     Current Outpatient Medications:  .  cetirizine (ZYRTEC) 10 MG tablet, Take 10 mg by mouth daily as needed for allergies., Disp: , Rfl:  .  esomeprazole (NEXIUM) 40 MG capsule, Take 40 mg by mouth daily at 12 noon., Disp: , Rfl:  .  levonorgestrel (MIRENA) 20 MCG/24HR IUD, 1 each by Intrauterine route once., Disp: , Rfl:  .  labetalol (NORMODYNE) 100 MG tablet, Take 1 tablet (100 mg total) by mouth 2 (two) times daily., Disp: 180 tablet, Rfl: 0   Patient Care Team: [facility] as PCP - General      Objective:   Vitals: BP 138/90 (BP Location: Left Arm, Patient Position: Sitting, Cuff Size: Normal)   Pulse 90   Temp 98.2 F (36.8 C) (Oral)   Resp 16   Ht 5\' 7"  (1.702 m)   Wt 236 lb 3.2 oz (107.1 kg)   BMI 36.99 kg/m    Vitals:   10/17/18 1510  BP: 138/90  Pulse: 90  Resp: 16  Temp: 98.2 F (36.8 C)  TempSrc: Oral  Weight: 236 lb 3.2 oz (107.1 kg)  Height: 5\' 7"  (1.702 m)     Physical Exam Constitutional:      Appearance: Normal appearance.  Cardiovascular:     Rate and Rhythm: Normal rate and regular rhythm.     Heart sounds: Normal heart sounds.  Pulmonary:     Effort: Pulmonary effort is normal.     Breath sounds: Normal breath sounds.  Skin:    General: Skin is warm and dry.  Neurological:     Mental Status: She is alert and oriented to person, place, and time. Mental status is at  baseline.  Psychiatric:        Mood and Affect: Mood normal.        Behavior: Behavior normal.      Depression Screen PHQ 2/9 Scores 10/17/2018 10/06/2015  PHQ - 2 Score 0 0  PHQ- 9 Score 0 -      Assessment & Plan:     Routine Health Maintenance and Physical Exam  Exercise Activities and Dietary recommendations Goals   None     There  is no immunization history for the selected administration types on file for this patient.  Health Maintenance  Topic Date Due  . TETANUS/TDAP  01/30/2000  . PAP SMEAR-Modifier  01/29/2002  . INFLUENZA VACCINE  05/09/2018  . HIV Screening  Completed     Discussed health benefits of physical activity, and encouraged her to engage in regular exercise appropriate for her age and condition.   1. Annual physical exam  Requesting records from [facility].  2. Family history of essential hypertension   3. Screening cholesterol level  - Lipid Profile  4. Diabetes mellitus screening  - Comprehensive Metabolic Panel (CMET)  5. Thyroid disorder screening  - TSH  6. Screening for deficiency anemia  - CBC with Differential  7. Encounter for screening for HIV  - HIV antibody (with reflex)  8. Hypertension, unspecified type  Outside readings hypertensive and reading today in office hypertensive. Will start medication as below.  - labetalol (NORMODYNE) 100 MG tablet; Take 1 tablet (100 mg total) by mouth 2 (two) times daily.  Dispense: 180 tablet; Refill: 0  The entirety of the information documented in the History of Present Illness, Review of Systems and Physical Exam were personally obtained by me. Portions of this information were initially documented by Rondel Baton, CMA and reviewed by me for thoroughness and accuracy.   Return in about 1 month (around 11/17/2018) for HTN .  --------------------------------------------------------------------    Trey Sailors, PA-C  [facility]

## 2018-10-17 NOTE — Patient Instructions (Signed)

## 2018-10-18 ENCOUNTER — Telehealth: Payer: Self-pay

## 2018-10-18 LAB — CBC WITH DIFFERENTIAL/PLATELET
Basophils Absolute: 0.1 10*3/uL (ref 0.0–0.2)
Basos: 1 %
EOS (ABSOLUTE): 0.1 10*3/uL (ref 0.0–0.4)
Eos: 1 %
Hematocrit: 37.7 % (ref 34.0–46.6)
Hemoglobin: 12.7 g/dL (ref 11.1–15.9)
Immature Grans (Abs): 0 10*3/uL (ref 0.0–0.1)
Immature Granulocytes: 0 %
Lymphocytes Absolute: 2.4 10*3/uL (ref 0.7–3.1)
Lymphs: 33 %
MCH: 28.3 pg (ref 26.6–33.0)
MCHC: 33.7 g/dL (ref 31.5–35.7)
MCV: 84 fL (ref 79–97)
Monocytes Absolute: 0.5 10*3/uL (ref 0.1–0.9)
Monocytes: 7 %
Neutrophils Absolute: 4.3 10*3/uL (ref 1.4–7.0)
Neutrophils: 58 %
Platelets: 362 10*3/uL (ref 150–450)
RBC: 4.49 x10E6/uL (ref 3.77–5.28)
RDW: 12.9 % (ref 11.7–15.4)
WBC: 7.3 10*3/uL (ref 3.4–10.8)

## 2018-10-18 LAB — HIV ANTIBODY (ROUTINE TESTING W REFLEX): HIV Screen 4th Generation wRfx: NONREACTIVE

## 2018-10-18 LAB — COMPREHENSIVE METABOLIC PANEL
ALT: 20 IU/L (ref 0–32)
AST: 16 IU/L (ref 0–40)
Albumin/Globulin Ratio: 1.5 (ref 1.2–2.2)
Albumin: 4.4 g/dL (ref 3.5–5.5)
Alkaline Phosphatase: 64 IU/L (ref 39–117)
BUN/Creatinine Ratio: 14 (ref 9–23)
BUN: 10 mg/dL (ref 6–20)
Bilirubin Total: 0.3 mg/dL (ref 0.0–1.2)
CO2: 23 mmol/L (ref 20–29)
Calcium: 9.5 mg/dL (ref 8.7–10.2)
Chloride: 103 mmol/L (ref 96–106)
Creatinine, Ser: 0.69 mg/dL (ref 0.57–1.00)
GFR calc Af Amer: 129 mL/min/{1.73_m2} (ref >59)
GFR calc non Af Amer: 112 mL/min/{1.73_m2} (ref >59)
Globulin, Total: 3 g/dL (ref 1.5–4.5)
Glucose: 98 mg/dL (ref 65–99)
Potassium: 4 mmol/L (ref 3.5–5.2)
Sodium: 140 mmol/L (ref 134–144)
Total Protein: 7.4 g/dL (ref 6.0–8.5)

## 2018-10-18 LAB — LIPID PANEL
Chol/HDL Ratio: 4.9 ratio — ABNORMAL HIGH (ref 0.0–4.4)
Cholesterol, Total: 201 mg/dL — ABNORMAL HIGH (ref 100–199)
HDL: 41 mg/dL (ref >39)
LDL Calculated: 139 mg/dL — ABNORMAL HIGH (ref 0–99)
Triglycerides: 105 mg/dL (ref 0–149)
VLDL Cholesterol Cal: 21 mg/dL (ref 5–40)

## 2018-10-18 LAB — TSH: TSH: 1.1 u[IU]/mL (ref 0.450–4.500)

## 2018-10-18 NOTE — Telephone Encounter (Signed)
[id number] 

## 2018-10-18 NOTE — Telephone Encounter (Signed)
-----   Message from Trey SailorsAdriana M Pollak, [place] JerseyPA-C sent at 10/18/2018  9:09 AM EST ----- Labs do not show diabetes, good kdiney and liver function. TSH and blood counts normal. HIV negative. Cholesterol modestly high but would not recommend cholesterol medication. Would recommend increasing exercise and reducing saturated fats, red meats, butter.

## 2018-10-21 NOTE — Telephone Encounter (Signed)
Patient advised as below.  

## 2018-12-16 ENCOUNTER — Ambulatory Visit: Payer: BC Managed Care – PPO | Admitting: Physician Assistant

## 2018-12-19 ENCOUNTER — Ambulatory Visit: Payer: BC Managed Care – PPO | Admitting: Physician Assistant

## 2019-03-04 ENCOUNTER — Other Ambulatory Visit: Payer: Self-pay | Admitting: Physician Assistant

## 2019-03-04 DIAGNOSIS — I10 Essential (primary) hypertension: Secondary | ICD-10-CM

## 2019-06-03 ENCOUNTER — Other Ambulatory Visit: Payer: Self-pay | Admitting: Physician Assistant

## 2019-06-03 DIAGNOSIS — I10 Essential (primary) hypertension: Secondary | ICD-10-CM

## 2019-06-03 NOTE — Telephone Encounter (Signed)
She needs a follow up. Medication started in January and not followed up. Please schedule, can be virtual if she has cuff.

## 2019-06-04 NOTE — Telephone Encounter (Signed)
Patient scheduled a mychart visit for 06/05/2019.

## 2019-06-05 ENCOUNTER — Encounter: Payer: Self-pay | Admitting: Physician Assistant

## 2019-06-05 ENCOUNTER — Telehealth (INDEPENDENT_AMBULATORY_CARE_PROVIDER_SITE_OTHER): Payer: BC Managed Care – PPO | Admitting: Physician Assistant

## 2019-06-05 DIAGNOSIS — Z124 Encounter for screening for malignant neoplasm of cervix: Secondary | ICD-10-CM | POA: Diagnosis not present

## 2019-06-05 DIAGNOSIS — I10 Essential (primary) hypertension: Secondary | ICD-10-CM | POA: Diagnosis not present

## 2019-06-05 NOTE — Progress Notes (Signed)
       Patient: Christina [name] Female    DOB: 08-11-1981   38 y.o.   MRN: [id number] Visit Date: 06/05/2019  Today's Provider: Trinna Post, PA-C   Chief Complaint  Patient presents with  . Hypertension   Subjective:    Virtual Visit via Telephone Note  I connected with Christina [name] on 06/05/19 at  4:00 PM EDT by a video enabled telemedicine application and verified that I am speaking with the correct person using two identifiers.   I discussed the limitations of evaluation and management by telemedicine and the availability of in person appointments. The patient expressed understanding and agreed to proceed.  Patient location: home Provider location: [place] office  Persons involved in the visit: patient, provider    HPI   Currently working in [place] and planning to go back half capacity September 21st, 2020.   HTN: Patient was started on labetalol 100 mg BID in 10/2018 for HTN.  Denies dizziness, fatigue. Feels OK. Has not checked her BP, possibly gave her cuff back to her parents. Pulse: resting heart rate 65 based on her apple watch.   Pulse Readings from Last 3 Encounters:  10/17/18 90  08/10/17 79  06/28/17 (!) 101       Allergies  Allergen Reactions  . Erythromycin Nausea And Vomiting     Current Outpatient Medications:  .  cetirizine (ZYRTEC) 10 MG tablet, Take 10 mg by mouth daily as needed for allergies., Disp: , Rfl:  .  esomeprazole (NEXIUM) 40 MG capsule, Take 40 mg by mouth daily at 12 noon., Disp: , Rfl:  .  fluticasone (FLONASE) 50 MCG/ACT nasal spray, , Disp: , Rfl:  .  labetalol (NORMODYNE) 100 MG tablet, TAKE 1 TABLET BY MOUTH TWICE A DAY, Disp: 60 tablet, Rfl: 0 .  levonorgestrel (MIRENA) 20 MCG/24HR IUD, 1 each by Intrauterine route once., Disp: , Rfl:   Review of Systems  Constitutional: Negative for appetite change, chills, fatigue and fever.  Respiratory: Negative for chest tightness  and shortness of breath.   Cardiovascular: Negative for chest pain and palpitations.  Gastrointestinal: Negative for abdominal pain, nausea and vomiting.  Neurological: Negative for dizziness and weakness.    Social History   Tobacco Use  . Smoking status: Never Smoker  . Smokeless tobacco: Never Used  Substance Use Topics  . Alcohol use: No      Objective:   There were no vitals taken for this visit. There were no vitals filed for this visit.   Physical Exam   No results found for any visits on 06/05/19.     Assessment & Plan    1. Essential hypertension  Heart rate has decreased but she overall feels well. She will attempt to check her blood pressure in the future. Follow up at physical.   2. Cervical Cancer Screening  Advised she is due 09/2020 for this. We can do this at her next CPE.     Trinna Post, PA-C  [facility]

## 2019-06-05 NOTE — Patient Instructions (Signed)

## 2019-06-16 ENCOUNTER — Other Ambulatory Visit: Payer: Self-pay | Admitting: Physician Assistant

## 2019-06-16 DIAGNOSIS — I10 Essential (primary) hypertension: Secondary | ICD-10-CM

## 2019-06-17 NOTE — Telephone Encounter (Signed)
L.O.V. was on 06/05/2019 via video.

## 2019-11-14 ENCOUNTER — Other Ambulatory Visit: Payer: BC Managed Care – PPO

## 2019-12-14 ENCOUNTER — Ambulatory Visit: Payer: BC Managed Care – PPO | Attending: Internal Medicine

## 2019-12-14 DIAGNOSIS — Z23 Encounter for immunization: Secondary | ICD-10-CM | POA: Insufficient documentation

## 2019-12-14 NOTE — Progress Notes (Signed)
   Covid-19 Vaccination Clinic  Name:  Christina [name]    MRN: [id number] DOB: [birth year]  12/14/2019  Christina [name] was observed post Covid-19 immunization for 15 minutes without incident. She was provided with Vaccine Information Sheet and instruction to access the V-Safe system.   Christina [name] was instructed to call 911 with any severe reactions post vaccine: Marland Kitchen Difficulty breathing  . Swelling of face and throat  . A fast heartbeat  . A bad rash all over body  . Dizziness and weakness   Immunizations Administered    Name Date Dose VIS Date Route   Pfizer COVID-19 Vaccine 12/14/2019  4:58 PM 0.3 mL 09/19/2019 Intramuscular   Manufacturer: [employer], [employer]   Lot: [id number]   NDC: [address]-2

## 2019-12-19 ENCOUNTER — Other Ambulatory Visit: Payer: Self-pay | Admitting: Physician Assistant

## 2019-12-19 DIAGNOSIS — I10 Essential (primary) hypertension: Secondary | ICD-10-CM

## 2020-01-04 ENCOUNTER — Ambulatory Visit: Payer: BC Managed Care – PPO | Attending: Internal Medicine

## 2020-01-04 ENCOUNTER — Other Ambulatory Visit: Payer: Self-pay

## 2020-01-04 DIAGNOSIS — Z23 Encounter for immunization: Secondary | ICD-10-CM

## 2020-01-04 NOTE — Progress Notes (Signed)
   Covid-19 Vaccination Clinic  Name:  Christina [name]    MRN: [id number] DOB: 11-26-1980  01/04/2020  [name] was observed post Covid-19 immunization for 15 minutes without incident. She was provided with Vaccine Information Sheet and instruction to access the V-Safe system.   [name] was instructed to call 911 with any severe reactions post vaccine: Marland Kitchen Difficulty breathing  . Swelling of face and throat  . A fast heartbeat  . A bad rash all over body  . Dizziness and weakness   Immunizations Administered    Name Date Dose VIS Date Route   Pfizer COVID-19 Vaccine 01/04/2020  2:46 PM 0.3 mL 09/19/2019 Intramuscular   Manufacturer: [employer], [employer]   Lot: [id number]   NDC: [address]-0

## 2020-01-10 ENCOUNTER — Other Ambulatory Visit: Payer: Self-pay | Admitting: Physician Assistant

## 2020-01-10 DIAGNOSIS — I10 Essential (primary) hypertension: Secondary | ICD-10-CM

## 2020-02-09 ENCOUNTER — Ambulatory Visit: Payer: BC Managed Care – PPO | Attending: Internal Medicine

## 2020-02-09 DIAGNOSIS — Z20822 Contact with and (suspected) exposure to covid-19: Secondary | ICD-10-CM

## 2020-02-10 LAB — NOVEL CORONAVIRUS, NAA: SARS-CoV-2, NAA: NOT DETECTED

## 2020-02-10 LAB — SARS-COV-2, NAA 2 DAY TAT

## 2020-02-11 ENCOUNTER — Encounter: Payer: BC Managed Care – PPO | Admitting: Physician Assistant

## 2020-02-11 ENCOUNTER — Other Ambulatory Visit: Payer: Self-pay

## 2020-02-11 NOTE — Progress Notes (Deleted)
Complete physical exam   Patient: Christina [name]   DOB: 1981-08-15   39 y.o. Female  MRN: [id number] Visit Date: 02/11/2020  Today's healthcare provider: Trey Sailors, PA-C   No chief complaint on file.  Subjective    Christina [name] is a 39 y.o. female who presents today for a complete physical exam.  She reports consuming a {diet types:17450} diet. {Exercise:19826} She generally feels {well/fairly well/poorly:18703}. She reports sleeping {well/fairly well/poorly:18703}. She {does/does not:200015} have additional problems to discuss today.  HPI  Hypertension, follow-up  BP Readings from Last 3 Encounters:  10/17/18 138/90  08/10/17 133/77  06/28/17 123/79   Wt Readings from Last 3 Encounters:  10/17/18 236 lb 3.2 oz (107.1 kg)  08/08/17 239 lb (108.4 kg)  02/10/16 234 lb (106.1 kg)     She was last seen for hypertension 8 months ago.  BP at that visit was a virtual visit. Management since that visit includes no changes.  She reports {excellent/good/fair/poor:19665} compliance with treatment. She {is/is not:9024} having side effects. {document side effects if present:1} She is following a {diet:21022986} diet. She {is/is not:9024} exercising. She {does/does not:200015} smoke.  Use of agents associated with hypertension: none.   Outside blood pressures are {***enter patient reported home BP readings, or 'not being checked':1}. Symptoms: {Yes/No:20286} chest pain {Yes/No:20286} chest pressure {Yes/No:20286} palpitations {Yes/No:20286} dyspnea {Yes/No:20286} orthopnea {Yes/No:20286} paroxysmal nocturnal dyspnea {Yes/No:20286} lower extremity edema {Yes/No:20286} syncope   Pertinent labs: Lab Results  Component Value Date   CHOL 201 (H) 10/17/2018   HDL 41 10/17/2018   LDLCALC 139 (H) 10/17/2018   TRIG 105 10/17/2018   CHOLHDL 4.9 (H) 10/17/2018   Lab Results  Component Value Date   NA 140 10/17/2018   K 4.0 10/17/2018   CO2 23  10/17/2018   GLUCOSE 98 10/17/2018   BUN 10 10/17/2018   CREATININE 0.69 10/17/2018   CALCIUM 9.5 10/17/2018   GFRNONAA 112 10/17/2018   GFRAA 129 10/17/2018     The ASCVD Risk score (Goff DC Jr., et al., 2013) failed to calculate for the following reasons:   The 2013 ASCVD risk score is only valid for ages 76 to 40   --------------------------------------------------------------------------------------------------- Lipid/Cholesterol, Follow-up  Last lipid panel Other pertinent labs  Lab Results  Component Value Date   CHOL 201 (H) 10/17/2018   HDL 41 10/17/2018   LDLCALC 139 (H) 10/17/2018   TRIG 105 10/17/2018   CHOLHDL 4.9 (H) 10/17/2018   Lab Results  Component Value Date   ALT 20 10/17/2018   AST 16 10/17/2018   PLT 362 10/17/2018   TSH 1.100 10/17/2018     She was last seen for this 16 months ago.  Management since that visit includes no changes.  She reports {excellent/good/fair/poor:19665} compliance with treatment. She {is/is not:9024} having side effects. {document side effects if present:1} Symptoms: {Yes/No:20286} chest pain {Yes/No:20286} chest pressure/discomfort {Yes/No:20286} dyspnea {Yes/No:20286} lower extremity edema {Yes/No:20286} numbness or tingling of extremity {Yes/No:20286} orthopnea {Yes/No:20286} palpitations {Yes/No:20286} paroxysmal nocturnal dyspnea {Yes/No:20286} speech difficulty {Yes/No:20286} syncope  Current diet: {diet habits:16563} Current exercise: {exercise types:16438}  Wt Readings from Last 3 Encounters:  10/17/18 236 lb 3.2 oz (107.1 kg)  08/08/17 239 lb (108.4 kg)  02/10/16 234 lb (106.1 kg)   The ASCVD Risk score Denman George DC Jr., et al., 2013) failed to calculate for the following reasons:   The 2013 ASCVD risk score is only valid for ages 61 to 43  -----------------------------------------------------------------------------------------   Past Medical History:  Diagnosis Date  . Allergy   . Anemia   . Anxiety    . GERD (gastroesophageal reflux disease)   . Hyperlipidemia   . Hypertension   . Medical history non-contributory   . Postpartum care following vaginal delivery (5/5) 02/11/2016  . Pregnancy induced hypertension   . Preterm labor   . Vaginal Pap smear, abnormal    Past Surgical History:  Procedure Laterality Date  . MANDIBLE SURGERY     Social History   Socioeconomic History  . Marital status: Married    Spouse name: Not on file  . Number of children: Not on file  . Years of education: Not on file  . Highest education level: Not on file  Occupational History  . Not on file  Tobacco Use  . Smoking status: Never Smoker  . Smokeless tobacco: Never Used  Substance and Sexual Activity  . Alcohol use: No  . Drug use: No  . Sexual activity: Yes  Other Topics Concern  . Not on file  Social History Narrative  . Not on file   Social Determinants of Health   Financial Resource Strain:   . Difficulty of Paying Living Expenses:   Food Insecurity:   . Worried About Programme researcher, broadcasting/film/video in the Last Year:   . Barista in the Last Year:   Transportation Needs:   . Freight forwarder (Medical):   Marland Kitchen Lack of Transportation (Non-Medical):   Physical Activity:   . Days of Exercise per Week:   . Minutes of Exercise per Session:   Stress:   . Feeling of Stress :   Social Connections:   . Frequency of Communication with Friends and Family:   . Frequency of Social Gatherings with Friends and Family:   . Attends Religious Services:   . Active Member of Clubs or Organizations:   . Attends Banker Meetings:   Marland Kitchen Marital Status:   Intimate Partner Violence:   . Fear of Current or Ex-Partner:   . Emotionally Abused:   Marland Kitchen Physically Abused:   . Sexually Abused:    Family Status  Relation Name Status  . Mother  Alive  . Father  Alive  . Brother  Alive  . MGM  (Not Specified)  . MGF  (Not Specified)  . PGM  (Not Specified)  . Neg Hx  (Not Specified)    Family History  Problem Relation Age of Onset  . Hyperlipidemia Mother   . Hypertension Mother   . Cataracts Mother   . Hypertension Father   . Hyperlipidemia Father   . Anemia Father   . Hypertension Brother   . Hyperlipidemia Brother   . Hypertension Maternal Grandmother   . Hyperlipidemia Maternal Grandmother   . Dementia Maternal Grandmother   . Colon cancer Maternal Grandfather   . Hypertension Paternal Grandmother   . Hyperlipidemia Paternal Grandmother   . Rheum arthritis Neg Hx   . Osteoarthritis Neg Hx    Allergies  Allergen Reactions  . Erythromycin Nausea And Vomiting    Patient Care Team: Maryella Shivers as PCP - General (Physician Assistant)   Medications: Outpatient Medications Prior to Visit  Medication Sig  . cetirizine (ZYRTEC) 10 MG tablet Take 10 mg by mouth daily as needed for allergies.  Marland Kitchen esomeprazole (NEXIUM) 40 MG capsule Take 40 mg by mouth daily at 12 noon.  . fluticasone (FLONASE) 50 MCG/ACT nasal spray   . labetalol (NORMODYNE) 100 MG tablet TAKE 1  TABLET BY MOUTH TWICE A DAY  . levonorgestrel (MIRENA) 20 MCG/24HR IUD 1 each by Intrauterine route once.   No facility-administered medications prior to visit.    Review of Systems  Constitutional: Negative.  Negative for chills, fatigue and fever.  HENT: Negative.  Negative for congestion, ear pain, rhinorrhea, sneezing and sore throat.   Eyes: Negative.  Negative for pain and redness.  Respiratory: Negative.  Negative for cough, shortness of breath and wheezing.   Cardiovascular: Negative.  Negative for chest pain and leg swelling.  Gastrointestinal: Negative.  Negative for abdominal pain, blood in stool, constipation, diarrhea and nausea.  Endocrine: Negative.  Negative for polydipsia and polyphagia.  Genitourinary: Negative.  Negative for dysuria, flank pain, hematuria, pelvic pain, vaginal bleeding and vaginal discharge.  Musculoskeletal: Negative.  Negative for arthralgias, back  pain, gait problem and joint swelling.  Skin: Negative.  Negative for rash.  Allergic/Immunologic: Negative.   Neurological: Negative.  Negative for dizziness, tremors, seizures, weakness, light-headedness, numbness and headaches.  Hematological: Negative.  Negative for adenopathy.  Psychiatric/Behavioral: Negative.  Negative for behavioral problems, confusion and dysphoric mood. The patient is not nervous/anxious and is not hyperactive.     {Show previous labs (optional):23779::" "}  Objective    There were no vitals taken for this visit. {Show previous vital signs (optional):23777::" "}  Physical Exam  ***  Depression Screen  PHQ 2/9 Scores 10/17/2018 10/06/2015  PHQ - 2 Score 0 0  PHQ- 9 Score 0 -    No results found for any visits on 02/11/20.  Assessment & Plan    Routine Health Maintenance and Physical Exam  Exercise Activities and Dietary recommendations Goals   None     Immunization History  Administered Date(s) Administered  . PFIZER SARS-COV-2 Vaccination 12/14/2019, 01/04/2020    Health Maintenance  Topic Date Due  . TETANUS/TDAP  Never done  . INFLUENZA VACCINE  05/09/2020  . PAP SMEAR-Modifier  09/18/2020  . COVID-19 Vaccine  Completed  . HIV Screening  Completed    Discussed health benefits of physical activity, and encouraged her to engage in regular exercise appropriate for her age and condition.  ***  No follow-ups on file.     {provider attestation***:1}   Paulene Floor  [facility] [phone] (phone) [phone] (fax)  [place]

## 2020-03-03 ENCOUNTER — Other Ambulatory Visit: Payer: Self-pay

## 2020-03-03 ENCOUNTER — Ambulatory Visit (INDEPENDENT_AMBULATORY_CARE_PROVIDER_SITE_OTHER): Payer: BC Managed Care – PPO | Admitting: Physician Assistant

## 2020-03-03 VITALS — BP 136/98 | HR 85 | Temp 96.9°F | Ht 67.0 in | Wt 174.4 lb

## 2020-03-03 DIAGNOSIS — Z0000 Encounter for general adult medical examination without abnormal findings: Secondary | ICD-10-CM

## 2020-03-03 DIAGNOSIS — I10 Essential (primary) hypertension: Secondary | ICD-10-CM | POA: Diagnosis not present

## 2020-03-03 MED ORDER — LABETALOL HCL 100 MG PO TABS: 100.0000 mg | ORAL_TABLET | Freq: Two times a day (BID) | ORAL | 3 refills | Status: DC

## 2020-03-03 NOTE — Patient Instructions (Signed)
Health Maintenance, Female Adopting a healthy lifestyle and getting preventive care are important in promoting health and wellness. Ask your health care provider about:  The right schedule for you to have regular tests and exams.  Things you can do on your own to prevent diseases and keep yourself healthy. What should I know about diet, weight, and exercise? Eat a healthy diet   Eat a diet that includes plenty of vegetables, fruits, low-fat dairy products, and lean protein.  Do not eat a lot of foods that are high in solid fats, added sugars, or sodium. Maintain a healthy weight Body mass index (BMI) is used to identify weight problems. It estimates body fat based on height and weight. Your health care provider can help determine your BMI and help you achieve or maintain a healthy weight. Get regular exercise Get regular exercise. This is one of the most important things you can do for your health. Most adults should:  Exercise for at least 150 minutes each week. The exercise should increase your heart rate and make you sweat (moderate-intensity exercise).  Do strengthening exercises at least twice a week. This is in addition to the moderate-intensity exercise.  Spend less time sitting. Even light physical activity can be beneficial. Watch cholesterol and blood lipids Have your blood tested for lipids and cholesterol at 39 years of age, then have this test every 5 years. Have your cholesterol levels checked more often if:  Your lipid or cholesterol levels are high.  You are older than 40 years of age.  You are at high risk for heart disease. What should I know about cancer screening? Depending on your health history and family history, you may need to have cancer screening at various ages. This may include screening for:  Breast cancer.  Cervical cancer.  Colorectal cancer.  Skin cancer.  Lung cancer. What should I know about heart disease, diabetes, and high blood  pressure? Blood pressure and heart disease  High blood pressure causes heart disease and increases the risk of stroke. This is more likely to develop in people who have high blood pressure readings, are of African descent, or are overweight.  Have your blood pressure checked: ? Every 3-5 years if you are 18-39 years of age. ? Every year if you are 40 years old or older. Diabetes Have regular diabetes screenings. This checks your fasting blood sugar level. Have the screening done:  Once every three years after age 40 if you are at a normal weight and have a low risk for diabetes.  More often and at a younger age if you are overweight or have a high risk for diabetes. What should I know about preventing infection? Hepatitis B If you have a higher risk for hepatitis B, you should be screened for this virus. Talk with your health care provider to find out if you are at risk for hepatitis B infection. Hepatitis C Testing is recommended for:  Everyone born from 1945 through 1965.  Anyone with known risk factors for hepatitis C. Sexually transmitted infections (STIs)  Get screened for STIs, including gonorrhea and chlamydia, if: ? You are sexually active and are younger than 39 years of age. ? You are older than 39 years of age and your health care provider tells you that you are at risk for this type of infection. ? Your sexual activity has changed since you were last screened, and you are at increased risk for chlamydia or gonorrhea. Ask your health care provider if   you are at risk.  Ask your health care provider about whether you are at high risk for HIV. Your health care provider may recommend a prescription medicine to help prevent HIV infection. If you choose to take medicine to prevent HIV, you should first get tested for HIV. You should then be tested every 3 months for as long as you are taking the medicine. Pregnancy  If you are about to stop having your period (premenopausal) and  you may become pregnant, seek counseling before you get pregnant.  Take 400 to 800 micrograms (mcg) of folic acid every day if you become pregnant.  Ask for birth control (contraception) if you want to prevent pregnancy. Osteoporosis and menopause Osteoporosis is a disease in which the bones lose minerals and strength with aging. This can result in bone fractures. If you are 65 years old or older, or if you are at risk for osteoporosis and fractures, ask your health care provider if you should:  Be screened for bone loss.  Take a calcium or vitamin D supplement to lower your risk of fractures.  Be given hormone replacement therapy (HRT) to treat symptoms of menopause. Follow these instructions at home: Lifestyle  Do not use any products that contain nicotine or tobacco, such as cigarettes, e-cigarettes, and chewing tobacco. If you need help quitting, ask your health care provider.  Do not use street drugs.  Do not share needles.  Ask your health care provider for help if you need support or information about quitting drugs. Alcohol use  Do not drink alcohol if: ? Your health care provider tells you not to drink. ? You are pregnant, may be pregnant, or are planning to become pregnant.  If you drink alcohol: ? Limit how much you use to 0-1 drink a day. ? Limit intake if you are breastfeeding.  Be aware of how much alcohol is in your drink. In the [place] one drink equals one 12 oz bottle of beer (355 mL), one 5 oz glass of wine (148 mL), or one 1 oz glass of hard liquor (44 mL). General instructions  Schedule regular health, dental, and eye exams.  Stay current with your vaccines.  Tell your health care provider if: ? You often feel depressed. ? You have ever been abused or do not feel safe at home. Summary  Adopting a healthy lifestyle and getting preventive care are important in promoting health and wellness.  Follow your health care provider's instructions about healthy  diet, exercising, and getting tested or screened for diseases.  Follow your health care provider's instructions on monitoring your cholesterol and blood pressure. This information is not intended to replace advice given to you by your health care provider. Make sure you discuss any questions you have with your health care provider. Document Revised: 09/18/2018 Document Reviewed: 09/18/2018 Elsevier Patient Education  2020 Elsevier Inc.  

## 2020-03-03 NOTE — Progress Notes (Signed)
Complete physical exam   Patient: Christina [name]   DOB: 09-09-1981   39 y.o. Female  MRN: [id number] Visit Date: 03/03/2020  Today's healthcare provider: Trey Sailors, PA-C   Chief Complaint  Patient presents with  . Annual Exam  I,Glennis Borger M [name],acting as a scribe for Trey Sailors, PA-C.,have documented all relevant documentation on the behalf of Trey Sailors, PA-C,as directed by  Trey Sailors, PA-C while in the presence of Trey Sailors, PA-C.  Subjective    Christina [name] is a 39 y.o. female who presents today for a complete physical exam.  She reports consuming a weight watchers diet. The patient does not participate in regular exercise at present. She generally feels well. She reports sleeping well. She does have additional problems to discuss today.  HPI   She is having light spotting and will pursue PAP smear with her OBGYN which is [employer], broadcasting/film/video.  Hypertension, follow-up  BP Readings from Last 3 Encounters:  03/03/20 (!) 136/98  10/17/18 138/90  08/10/17 133/77   Wt Readings from Last 3 Encounters:  03/03/20 174 lb 6.4 oz (79.1 kg)  10/17/18 236 lb 3.2 oz (107.1 kg)  08/08/17 239 lb (108.4 kg)     She was last seen for hypertension 9 months ago.  BP at that visit was not checked due to virtual visit. Management since that visit includes not changes.  She reports poor compliance with treatment. Due to not having refills on medication. She is not having side effects.  She is following a weight watchers diet. She is not exercising. She does not smoke.  Use of agents associated with hypertension: none.   Outside blood pressures are not being checked. Symptoms: No chest pain No chest pressure  No palpitations No syncope  No dyspnea No orthopnea  No paroxysmal nocturnal dyspnea No lower extremity edema   Pertinent labs: Lab Results  Component Value Date   CHOL 201 (H) 10/17/2018   HDL 41 10/17/2018   LDLCALC 139  (H) 10/17/2018   TRIG 105 10/17/2018   CHOLHDL 4.9 (H) 10/17/2018   Lab Results  Component Value Date   NA 140 10/17/2018   K 4.0 10/17/2018   CREATININE 0.69 10/17/2018   GFRNONAA 112 10/17/2018   GFRAA 129 10/17/2018   GLUCOSE 98 10/17/2018     The ASCVD Risk score (Goff DC Jr., et al., 2013) failed to calculate for the following reasons:   The 2013 ASCVD risk score is only valid for ages 73 to 11   ---------------------------------------------------------------------------------------------------   Past Medical History:  Diagnosis Date  . Allergy   . Anemia   . Anxiety   . GERD (gastroesophageal reflux disease)   . Hyperlipidemia   . Hypertension   . Medical history non-contributory   . Postpartum care following vaginal delivery (5/5) 02/11/2016  . Pregnancy induced hypertension   . Preterm labor   . Vaginal Pap smear, abnormal    Past Surgical History:  Procedure Laterality Date  . MANDIBLE SURGERY     Social History   Socioeconomic History  . Marital status: Married    Spouse name: Not on file  . Number of children: Not on file  . Years of education: Not on file  . Highest education level: Not on file  Occupational History  . Not on file  Tobacco Use  . Smoking status: Never Smoker  . Smokeless tobacco: Never Used  Substance and Sexual Activity  . Alcohol use:  No  . Drug use: No  . Sexual activity: Yes  Other Topics Concern  . Not on file  Social History Narrative  . Not on file   Social Determinants of Health   Financial Resource Strain:   . Difficulty of Paying Living Expenses:   Food Insecurity:   . Worried About Programme researcher, broadcasting/film/video in the Last Year:   . Barista in the Last Year:   Transportation Needs:   . Freight forwarder (Medical):   Marland Kitchen Lack of Transportation (Non-Medical):   Physical Activity:   . Days of Exercise per Week:   . Minutes of Exercise per Session:   Stress:   . Feeling of Stress :   Social Connections:   .  Frequency of Communication with Friends and Family:   . Frequency of Social Gatherings with Friends and Family:   . Attends Religious Services:   . Active Member of Clubs or Organizations:   . Attends Banker Meetings:   Marland Kitchen Marital Status:   Intimate Partner Violence:   . Fear of Current or Ex-Partner:   . Emotionally Abused:   Marland Kitchen Physically Abused:   . Sexually Abused:    Family Status  Relation Name Status  . Mother  Alive  . Father  Alive  . Brother  Alive  . MGM  (Not Specified)  . MGF  (Not Specified)  . PGM  (Not Specified)  . Neg Hx  (Not Specified)   Family History  Problem Relation Age of Onset  . Hyperlipidemia Mother   . Hypertension Mother   . Cataracts Mother   . Hypertension Father   . Hyperlipidemia Father   . Anemia Father   . Hypertension Brother   . Hyperlipidemia Brother   . Hypertension Maternal Grandmother   . Hyperlipidemia Maternal Grandmother   . Dementia Maternal Grandmother   . Colon cancer Maternal Grandfather   . Hypertension Paternal Grandmother   . Hyperlipidemia Paternal Grandmother   . Rheum arthritis Neg Hx   . Osteoarthritis Neg Hx    Allergies  Allergen Reactions  . Erythromycin Nausea And Vomiting    Patient Care Team: Maryella Shivers as PCP - General (Physician Assistant)   Medications: Outpatient Medications Prior to Visit  Medication Sig  . cetirizine (ZYRTEC) 10 MG tablet Take 10 mg by mouth daily as needed for allergies.  Marland Kitchen NIKKI 3-0.02 MG tablet Take 1 tablet by mouth daily.  Marland Kitchen esomeprazole (NEXIUM) 40 MG capsule Take 40 mg by mouth daily at 12 noon.  . fluticasone (FLONASE) 50 MCG/ACT nasal spray   . levonorgestrel (MIRENA) 20 MCG/24HR IUD 1 each by Intrauterine route once.  . [DISCONTINUED] labetalol (NORMODYNE) 100 MG tablet TAKE 1 TABLET BY MOUTH TWICE A DAY (Patient not taking: Reported on 03/03/2020)   No facility-administered medications prior to visit.    Review of Systems    Constitutional: Negative.   HENT: Negative.   Eyes: Negative.   Respiratory: Negative.   Cardiovascular: Negative.   Gastrointestinal: Negative.   Endocrine: Negative.   Genitourinary: Negative.   Musculoskeletal: Negative.   Skin: Negative.   Allergic/Immunologic: Negative.   Neurological: Negative.   Hematological: Negative.   Psychiatric/Behavioral: Negative.       Objective    BP (!) 136/98 (BP Location: Left Arm, Patient Position: Sitting, Cuff Size: Normal)   Pulse 85   Temp (!) 96.9 F (36.1 C) (Temporal)   Ht 5\' 7"  (1.702 m)   Wt  174 lb 6.4 oz (79.1 kg)   BMI 27.31 kg/m    Physical Exam Constitutional:      Appearance: Normal appearance.  HENT:     Right Ear: Tympanic membrane, ear canal and external ear normal.     Left Ear: Tympanic membrane, ear canal and external ear normal.  Cardiovascular:     Rate and Rhythm: Normal rate and regular rhythm.     Pulses: Normal pulses.     Heart sounds: Normal heart sounds.  Pulmonary:     Effort: Pulmonary effort is normal.     Breath sounds: Normal breath sounds.  Abdominal:     General: Abdomen is flat. Bowel sounds are normal.     Palpations: Abdomen is soft.  Skin:    General: Skin is warm and dry.  Neurological:     General: No focal deficit present.     Mental Status: She is alert and oriented to person, place, and time.  Psychiatric:        Mood and Affect: Mood normal.        Behavior: Behavior normal.       Depression Screen  PHQ 2/9 Scores 03/03/2020 10/17/2018 10/06/2015  PHQ - 2 Score 0 0 0  PHQ- 9 Score 0 0 -    No results found for any visits on 03/03/20.  Assessment & Plan    1. Annual physical exam  - TSH - Lipid panel - Comprehensive metabolic panel - CBC with Differential/Platelet  2. Hypertension, unspecified type Patient reports not taking blood pressure medication due to be out and not having refills left. Patient medication was refilled as below. - labetalol (NORMODYNE) 100  MG tablet; Take 1 tablet (100 mg total) by mouth 2 (two) times daily.  Dispense: 180 tablet; Refill: 3   Routine Health Maintenance and Physical Exam  Exercise Activities and Dietary recommendations Goals   None     Immunization History  Administered Date(s) Administered  . PFIZER SARS-COV-2 Vaccination 12/14/2019, 01/04/2020    Health Maintenance  Topic Date Due  . TETANUS/TDAP  03/03/2021 (Originally 01/30/2000)  . INFLUENZA VACCINE  05/09/2020  . PAP SMEAR-Modifier  09/18/2020  . COVID-19 Vaccine  Completed  . HIV Screening  Completed    Discussed health benefits of physical activity, and encouraged her to engage in regular exercise appropriate for her age and condition.     ITrinna Post, PA-C, have reviewed all documentation for this visit. The documentation on 03/03/20 for the exam, diagnosis, procedures, and orders are all accurate and complete.    Paulene Floor  [facility] [phone] (phone) [phone] (fax)  [place]

## 2020-03-04 LAB — CBC WITH DIFFERENTIAL/PLATELET
Basophils Absolute: 0 10*3/uL (ref 0.0–0.2)
Basos: 0 %
EOS (ABSOLUTE): 0 10*3/uL (ref 0.0–0.4)
Eos: 0 %
Hematocrit: 39.7 % (ref 34.0–46.6)
Hemoglobin: 13.6 g/dL (ref 11.1–15.9)
Immature Grans (Abs): 0 10*3/uL (ref 0.0–0.1)
Immature Granulocytes: 0 %
Lymphocytes Absolute: 2.7 10*3/uL (ref 0.7–3.1)
Lymphs: 30 %
MCH: 30.4 pg (ref 26.6–33.0)
MCHC: 34.3 g/dL (ref 31.5–35.7)
MCV: 89 fL (ref 79–97)
Monocytes Absolute: 0.5 10*3/uL (ref 0.1–0.9)
Monocytes: 5 %
Neutrophils Absolute: 5.7 10*3/uL (ref 1.4–7.0)
Neutrophils: 65 %
Platelets: 347 10*3/uL (ref 150–450)
RBC: 4.48 x10E6/uL (ref 3.77–5.28)
RDW: 12.4 % (ref 11.7–15.4)
WBC: 9 10*3/uL (ref 3.4–10.8)

## 2020-03-04 LAB — COMPREHENSIVE METABOLIC PANEL
ALT: 14 IU/L (ref 0–32)
AST: 14 IU/L (ref 0–40)
Albumin/Globulin Ratio: 1.4 (ref 1.2–2.2)
Albumin: 4.5 g/dL (ref 3.8–4.8)
Alkaline Phosphatase: 51 IU/L (ref 48–121)
BUN/Creatinine Ratio: 18 (ref 9–23)
BUN: 16 mg/dL (ref 6–20)
Bilirubin Total: 0.3 mg/dL (ref 0.0–1.2)
CO2: 19 mmol/L — ABNORMAL LOW (ref 20–29)
Calcium: 9.8 mg/dL (ref 8.7–10.2)
Chloride: 103 mmol/L (ref 96–106)
Creatinine, Ser: 0.87 mg/dL (ref 0.57–1.00)
GFR calc Af Amer: 97 mL/min/{1.73_m2} (ref >59)
GFR calc non Af Amer: 84 mL/min/{1.73_m2} (ref >59)
Globulin, Total: 3.3 g/dL (ref 1.5–4.5)
Glucose: 85 mg/dL (ref 65–99)
Potassium: 4.4 mmol/L (ref 3.5–5.2)
Sodium: 138 mmol/L (ref 134–144)
Total Protein: 7.8 g/dL (ref 6.0–8.5)

## 2020-03-04 LAB — TSH: TSH: 1.47 u[IU]/mL (ref 0.450–4.500)

## 2020-03-04 LAB — LIPID PANEL
Chol/HDL Ratio: 3.7 ratio (ref 0.0–4.4)
Cholesterol, Total: 216 mg/dL — ABNORMAL HIGH (ref 100–199)
HDL: 58 mg/dL (ref >39)
LDL Chol Calc (NIH): 129 mg/dL — ABNORMAL HIGH (ref 0–99)
Triglycerides: 164 mg/dL — ABNORMAL HIGH (ref 0–149)
VLDL Cholesterol Cal: 29 mg/dL (ref 5–40)

## 2020-10-15 ENCOUNTER — Ambulatory Visit: Payer: BC Managed Care – PPO | Admitting: Physician Assistant

## 2021-03-04 ENCOUNTER — Encounter: Payer: BC Managed Care – PPO | Admitting: Physician Assistant

## 2021-04-19 ENCOUNTER — Other Ambulatory Visit: Payer: Self-pay | Admitting: Adult Health

## 2021-04-19 ENCOUNTER — Ambulatory Visit
Admission: RE | Admit: 2021-04-19 | Discharge: 2021-04-19 | Disposition: A | Discharge status: Home / Self Care | Payer: BC Managed Care – PPO | Source: Ambulatory Visit | Attending: Adult Health | Admitting: Adult Health

## 2021-04-19 DIAGNOSIS — R7612 Nonspecific reaction to cell mediated immunity measurement of gamma interferon antigen response without active tuberculosis: Secondary | ICD-10-CM

## 2021-08-01 ENCOUNTER — Ambulatory Visit: Payer: Self-pay | Admitting: Family Medicine

## 2021-08-01 NOTE — Progress Notes (Deleted)
      Established patient visit   Patient: Christina [name]   DOB: 17-Dec-1980   40 y.o. Female  MRN: [id number] Visit Date: 08/01/2021  Today's healthcare provider: Megan Mans, MD   No chief complaint on file.  Subjective    HPI  Patient is a 40 year old female who presents requesting to have her lipids checked.  She last had them checked in May of 2021. Lab Results  Component Value Date   CHOL 216 (H) 03/03/2020   HDL 58 03/03/2020   LDLCALC 129 (H) 03/03/2020   TRIG 164 (H) 03/03/2020   CHOLHDL 3.7 03/03/2020     {Link to patient history deactivated due to formatting error:1}  Medications: Outpatient Medications Prior to Visit  Medication Sig   cetirizine (ZYRTEC) 10 MG tablet Take 10 mg by mouth daily as needed for allergies.   esomeprazole (NEXIUM) 40 MG capsule Take 40 mg by mouth daily at 12 noon.   fluticasone (FLONASE) 50 MCG/ACT nasal spray    labetalol (NORMODYNE) 100 MG tablet Take 1 tablet (100 mg total) by mouth 2 (two) times daily.   levonorgestrel (MIRENA) 20 MCG/24HR IUD 1 each by Intrauterine route once.   NIKKI 3-0.02 MG tablet Take 1 tablet by mouth daily.   No facility-administered medications prior to visit.    Review of Systems  {Labs  Heme  Chem  Endocrine  Serology  Results Review (optional):23779}   Objective    There were no vitals taken for this visit. {Show previous vital signs (optional):23777}  Physical Exam  ***  No results found for any visits on 08/01/21.  Assessment & Plan     ***  No follow-ups on file.      {provider attestation***:1}   Megan Mans, MD  [facility] [phone] (phone) [phone] (fax)  [facility]

## 2021-08-02 ENCOUNTER — Other Ambulatory Visit: Payer: Self-pay

## 2021-08-02 ENCOUNTER — Ambulatory Visit: Payer: BC Managed Care – PPO | Admitting: Physician Assistant

## 2021-08-02 ENCOUNTER — Encounter: Payer: Self-pay | Admitting: Physician Assistant

## 2021-08-02 VITALS — BP 139/100 | HR 80 | Temp 97.8°F | Resp 16 | Wt 222.5 lb

## 2021-08-02 DIAGNOSIS — R0981 Nasal congestion: Secondary | ICD-10-CM

## 2021-08-02 DIAGNOSIS — E8881 Metabolic syndrome: Secondary | ICD-10-CM

## 2021-08-02 DIAGNOSIS — R0982 Postnasal drip: Secondary | ICD-10-CM | POA: Diagnosis not present

## 2021-08-02 DIAGNOSIS — E88819 Insulin resistance, unspecified: Secondary | ICD-10-CM

## 2021-08-02 DIAGNOSIS — I10 Essential (primary) hypertension: Secondary | ICD-10-CM | POA: Diagnosis not present

## 2021-08-02 DIAGNOSIS — E782 Mixed hyperlipidemia: Secondary | ICD-10-CM

## 2021-08-02 MED ORDER — FEXOFENADINE HCL 180 MG PO TABS: 180.0000 mg | ORAL_TABLET | Freq: Every day | ORAL | 3 refills | Status: DC

## 2021-08-02 MED ORDER — LISINOPRIL 10 MG PO TABS: 10.0000 mg | ORAL_TABLET | Freq: Every day | ORAL | 3 refills | Status: DC

## 2021-08-02 MED ORDER — AZELASTINE-FLUTICASONE 137-50 MCG/ACT NA SUSP: 1.0000 | Freq: Two times a day (BID) | NASAL | 1 refills | Status: DC

## 2021-08-02 NOTE — Assessment & Plan Note (Signed)
Discussed polypharmacy--pt has difficulty managing many medications.  We discussed starting over, d/c labetolol and amlodipine and starting on Lisinopril 10 mg daily. I advised her to take her blood pressure at home, if she feels dizzy/lightheaded, to take her BP. If <110/70 call office, if >160/100 call office. We will f/u 1 mo and make changes as necessary. For now continue Clonidine 0.1 mg at night for anxiety.

## 2021-08-02 NOTE — Assessment & Plan Note (Signed)
Chronic. Discussed starting azelastine/fluticasone nasal spray,1 spray each nostril up to twice a day. Stay consistent for at least 2 weeks. Advised netipot/saline nasal spray daily. Discussed switching to Allegra, pt has taken Clartin before, caused tinnitus.  Advised wearing mask at school if confirmed mold.

## 2021-08-02 NOTE — Assessment & Plan Note (Addendum)
History of mildly elevated LDL--pt was placed on Crestor 20 mg and Vascepa. Continue on these medications for now, check lipids today and reevaluate need for meds.

## 2021-08-02 NOTE — Assessment & Plan Note (Signed)
Will look at fasting glucose and a1c today to evaluate need for farxiga.

## 2021-08-02 NOTE — Progress Notes (Signed)
Established patient visit   Patient: Christina [name]   DOB: [birth year]   40 y.o. Female  MRN: [id number] Visit Date: 08/02/2021  Today's healthcare provider: Alfredia Ferguson, PA-C   Chief Complaint  Patient presents with   Hypertension   URI    Subjective    HPI HPI     URI   Associated symptoms inlclude congestion, cough, plugged ear sensation, rhinorrhea, sinus pain, sneezing, sore throat, swollen glands and tooth pain.  Recent episode started more than a month ago (Patient states that her job is being inspected for black mold ).  The problem has been unchanged since onset.  The temperature has been with in normal range.  Patient  is drinking plenty of fluids.  Patient is not a smoker.      Last edited by Fonda Kinder, CMA on 08/02/2021  8:26 AM.     She reports since moving jobs and schools, she has experienced a significant amount of nasal congestion/post nasal drip/ sinus pressure for the last month. History of mold allergy. She reports taking Zyrtec every day for allergy relief, unsure if it is still working.   Hypertension, follow-up  BP Readings from Last 3 Encounters:  08/02/21 (!) 139/100  03/03/20 (!) 136/98  10/17/18 138/90   Wt Readings from Last 3 Encounters:  08/02/21 222 lb 8 oz (100.9 kg)  03/03/20 174 lb 6.4 oz (79.1 kg)  10/17/18 236 lb 3.2 oz (107.1 kg)     She was last seen for hypertension 2 years ago.  She was seen over the summer by another practitioner who added Amlodipine 5 mg. She currently takes this once a day, and Labetaolol 100 mg BID. She takes 0.1 mg of clonidine at night for anxiety.  She reports excellent compliance with treatment. She is not having side effects.  She is following a Regular diet. She is not exercising. She does not smoke.  Outside blood pressures are not being checked. Symptoms: No chest pain No chest pressure  No palpitations No syncope  No dyspnea No orthopnea  No paroxysmal nocturnal  dyspnea No lower extremity edema   Pertinent labs: Lab Results  Component Value Date   CHOL 216 (H) 03/03/2020   HDL 58 03/03/2020   LDLCALC 129 (H) 03/03/2020   TRIG 164 (H) 03/03/2020   CHOLHDL 3.7 03/03/2020   Lab Results  Component Value Date   NA 138 03/03/2020   K 4.4 03/03/2020   CREATININE 0.87 03/03/2020   GFRNONAA 84 03/03/2020   GLUCOSE 85 03/03/2020   TSH 1.470 03/03/2020     The 10-year ASCVD risk score (Arnett DK, et al., 2019) is: 1%  She reports being put on Crestor 20 mg and Vasepa over the summer for cholesterol management.  She was also placed on Farxiga for 'heart protection'. ---------------------------------------------------------------------------------------------------    Medications: Outpatient Medications Prior to Visit  Medication Sig   cetirizine (ZYRTEC) 10 MG tablet Take 10 mg by mouth daily as needed for allergies.   cloNIDine (CATAPRES) 0.1 MG tablet SMARTSIG:1 Tablet(s) By Mouth Every Evening   escitalopram (LEXAPRO) 10 MG tablet Take 10 mg by mouth daily.   esomeprazole (NEXIUM) 40 MG capsule Take 40 mg by mouth daily at 12 noon.   FARXIGA 5 MG TABS tablet Take 5 mg by mouth daily.   HORIZANT 300 MG TBCR SMARTSIG:1 Tablet(s) By Mouth Every Evening   icosapent Ethyl (VASCEPA) 1 g capsule Take 2 g by mouth 2 (two) times daily.  NIKKI 3-0.02 MG tablet Take 1 tablet by mouth daily.   REXULTI 1 MG TABS tablet Take 1 mg by mouth daily.   rosuvastatin (CRESTOR) 20 MG tablet SMARTSIG:1 Tablet(s) By Mouth Every Evening   tiZANidine (ZANAFLEX) 4 MG tablet SMARTSIG:1 Tablet(s) By Mouth Every Evening   VYVANSE 30 MG capsule Take 30 mg by mouth 2 (two) times daily as needed.   zolpidem (AMBIEN CR) 6.25 MG CR tablet Take 6.25 mg by mouth daily.   [DISCONTINUED] amLODipine (NORVASC) 5 MG tablet Take 5 mg by mouth daily.   [DISCONTINUED] labetalol (NORMODYNE) 100 MG tablet Take 1 tablet (100 mg total) by mouth 2 (two) times daily.   [DISCONTINUED]  labetalol (NORMODYNE) 100 MG tablet Take 1 tablet by mouth 2 (two) times daily.   [DISCONTINUED] fluticasone (FLONASE) 50 MCG/ACT nasal spray    [DISCONTINUED] levonorgestrel (MIRENA) 20 MCG/24HR IUD 1 each by Intrauterine route once.   No facility-administered medications prior to visit.    Review of Systems  Constitutional:  Negative for fatigue and fever.  HENT:  Positive for congestion, ear pain, postnasal drip, rhinorrhea, sinus pressure, sinus pain and sore throat.   Eyes:  Negative for discharge.  Respiratory:  Negative for chest tightness and shortness of breath.   Cardiovascular:  Negative for chest pain.  Gastrointestinal:  Negative for abdominal pain.  Musculoskeletal:  Negative for arthralgias.  Neurological:  Negative for dizziness.      Objective    BP (!) 139/100   Pulse 80   Temp 97.8 F (36.6 C) (Temporal)   Resp 16   Wt 222 lb 8 oz (100.9 kg)   BMI 34.85 kg/m   Physical Exam Constitutional:      General: She is awake.     Appearance: She is well-developed.  HENT:     Head: Normocephalic.     Right Ear: Tympanic membrane normal.     Left Ear: Tympanic membrane normal.     Nose: Congestion present.     Mouth/Throat:     Mouth: Mucous membranes are dry.  Eyes:     Conjunctiva/sclera: Conjunctivae normal.  Cardiovascular:     Rate and Rhythm: Normal rate and regular rhythm.     Heart sounds: Normal heart sounds.  Pulmonary:     Effort: Pulmonary effort is normal.     Breath sounds: Normal breath sounds.  Skin:    General: Skin is warm.  Neurological:     Mental Status: She is alert and oriented to person, place, and time.  Psychiatric:        Attention and Perception: Attention normal.        Mood and Affect: Mood normal.        Speech: Speech normal.        Behavior: Behavior is cooperative.     No results found for any visits on 08/02/21.  Assessment & Plan     Problem List Items Addressed This Visit       Cardiovascular and  Mediastinum   HTN (hypertension) - Primary    Discussed polypharmacy--pt has difficulty managing many medications.  We discussed starting over, d/c labetolol and amlodipine and starting on Lisinopril 10 mg daily. I advised her to take her blood pressure at home, if she feels dizzy/lightheaded, to take her BP. If <110/70 call office, if >160/100 call office. We will f/u 1 mo and make changes as necessary. For now continue Clonidine 0.1 mg at night for anxiety.      Relevant Medications  rosuvastatin (CRESTOR) 20 MG tablet   icosapent Ethyl (VASCEPA) 1 g capsule   cloNIDine (CATAPRES) 0.1 MG tablet   lisinopril (ZESTRIL) 10 MG tablet   Other Relevant Orders   Comprehensive Metabolic Panel (CMET)     Respiratory   Sinus congestion    Chronic. Discussed starting azelastine/fluticasone nasal spray,1 spray each nostril up to twice a day. Stay consistent for at least 2 weeks. Advised netipot/saline nasal spray daily. Discussed switching to Allegra, pt has taken Clartin before, caused tinnitus.  Advised wearing mask at school if confirmed mold.       Relevant Medications   Azelastine-Fluticasone 137-50 MCG/ACT SUSP   fexofenadine (ALLEGRA ALLERGY) 180 MG tablet     Endocrine   Insulin resistance    Will look at fasting glucose and a1c today to evaluate need for farxiga.       Relevant Orders   HgB A1c     Other   HLD (hyperlipidemia)    History of mildly elevated LDL--pt was placed on Crestor 20 mg and Vascepa. Continue on these medications for now, check lipids today and reevaluate need for meds.       Relevant Medications   rosuvastatin (CRESTOR) 20 MG tablet   icosapent Ethyl (VASCEPA) 1 g capsule   cloNIDine (CATAPRES) 0.1 MG tablet   lisinopril (ZESTRIL) 10 MG tablet   Other Relevant Orders   Comprehensive Metabolic Panel (CMET)   Lipid Profile   Other Visit Diagnoses     Post-nasal drip       Relevant Medications   Azelastine-Fluticasone 137-50 MCG/ACT SUSP    fexofenadine (ALLEGRA ALLERGY) 180 MG tablet        Return in about 1 month (around 09/02/2021) for hypertension.      I, Alfredia Ferguson, PA-C have reviewed all documentation for this visit. The documentation on  08/02/2021 for the exam, diagnosis, procedures, and orders are all accurate and complete.    Alfredia Ferguson, PA-C  [facility] [phone] (phone) [phone] (fax)  [facility]

## 2021-08-03 LAB — COMPREHENSIVE METABOLIC PANEL
ALT: 25 IU/L (ref 0–32)
AST: 18 IU/L (ref 0–40)
Albumin/Globulin Ratio: 1.7 (ref 1.2–2.2)
Albumin: 4.8 g/dL (ref 3.8–4.8)
Alkaline Phosphatase: 71 IU/L (ref 44–121)
BUN/Creatinine Ratio: 12 (ref 9–23)
BUN: 9 mg/dL (ref 6–24)
Bilirubin Total: 0.4 mg/dL (ref 0.0–1.2)
CO2: 20 mmol/L (ref 20–29)
Calcium: 9.9 mg/dL (ref 8.7–10.2)
Chloride: 102 mmol/L (ref 96–106)
Creatinine, Ser: 0.78 mg/dL (ref 0.57–1.00)
Globulin, Total: 2.9 g/dL (ref 1.5–4.5)
Glucose: 110 mg/dL — ABNORMAL HIGH (ref 70–99)
Potassium: 4.2 mmol/L (ref 3.5–5.2)
Sodium: 139 mmol/L (ref 134–144)
Total Protein: 7.7 g/dL (ref 6.0–8.5)
eGFR: 98 mL/min/{1.73_m2} (ref >59)

## 2021-08-03 LAB — LIPID PANEL
Chol/HDL Ratio: 4.2 ratio (ref 0.0–4.4)
Cholesterol, Total: 233 mg/dL — ABNORMAL HIGH (ref 100–199)
HDL: 55 mg/dL (ref >39)
LDL Chol Calc (NIH): 134 mg/dL — ABNORMAL HIGH (ref 0–99)
Triglycerides: 250 mg/dL — ABNORMAL HIGH (ref 0–149)
VLDL Cholesterol Cal: 44 mg/dL — ABNORMAL HIGH (ref 5–40)

## 2021-08-03 LAB — HEMOGLOBIN A1C
Est. average glucose Bld gHb Est-mCnc: 105 mg/dL
Hgb A1c MFr Bld: 5.3 % (ref 4.8–5.6)

## 2021-08-30 NOTE — Progress Notes (Signed)
Established patient visit   Patient: Christina [name]   DOB: 1981/04/28   40 y.o. Female  MRN: [id number] Visit Date: 08/31/2021  Today's healthcare provider: [name], PA-C   Chief Complaint  Patient presents with   Follow-up    HTN   Subjective    HPI HPI     Follow-up    Additional comments: HTN      Last edited by Doristine Devoid, CMA on 08/31/2021  8:09 AM.      Hypertension, follow-up  BP Readings from Last 3 Encounters:  08/31/21 126/79  08/02/21 (!) 139/100  03/03/20 (!) 136/98   Wt Readings from Last 3 Encounters:  08/31/21 226 lb 3.2 oz (102.6 kg)  08/02/21 222 lb 8 oz (100.9 kg)  03/03/20 174 lb 6.4 oz (79.1 kg)     She was last seen for hypertension 1 months ago.  BP at that visit was 139/100. Management since that visit includes starting on Lisinopril 10 mg daily.  She reports excellent compliance with treatment. She is not having side effects.  She is following a Regular diet. She is not exercising. She does not smoke.   Outside blood pressures are not being checked. Symptoms: No chest pain No chest pressure  No palpitations No syncope  No dyspnea No orthopnea  No paroxysmal nocturnal dyspnea No lower extremity edema   Pertinent labs: Lab Results  Component Value Date   CHOL 233 (H) 08/02/2021   HDL 55 08/02/2021   LDLCALC 134 (H) 08/02/2021   TRIG 250 (H) 08/02/2021   CHOLHDL 4.2 08/02/2021   Lab Results  Component Value Date   NA 139 08/02/2021   K 4.2 08/02/2021   CREATININE 0.78 08/02/2021   EGFR 98 08/02/2021   GLUCOSE 110 (H) 08/02/2021   TSH 1.470 03/03/2020     The 10-year ASCVD risk score (Arnett DK, et al., 2019) is: 1%   Reports improvement in nasal congestion w/ new nasal spray. School did find mold, but they have a plan to remove.  She complains of right wrist pain occasionally when cutting, and some movements make her fingers numb. This only happens occasionally when she is able to get  sensation back.  Denies right hand tingling/numbness/pain at night.    Medications: Outpatient Medications Prior to Visit  Medication Sig   Azelastine-Fluticasone 137-50 MCG/ACT SUSP Place 1 spray into the nose every 12 (twelve) hours.   cetirizine (ZYRTEC) 10 MG tablet Take 10 mg by mouth daily as needed for allergies.   cloNIDine (CATAPRES) 0.1 MG tablet SMARTSIG:1 Tablet(s) By Mouth Every Evening   escitalopram (LEXAPRO) 10 MG tablet Take 10 mg by mouth daily.   esomeprazole (NEXIUM) 40 MG capsule Take 40 mg by mouth daily at 12 noon.   FARXIGA 5 MG TABS tablet Take 5 mg by mouth daily.   fexofenadine (ALLEGRA ALLERGY) 180 MG tablet Take 1 tablet (180 mg total) by mouth daily.   HORIZANT 300 MG TBCR SMARTSIG:1 Tablet(s) By Mouth Every Evening   icosapent Ethyl (VASCEPA) 1 g capsule Take 2 g by mouth 2 (two) times daily.   lisinopril (ZESTRIL) 10 MG tablet Take 1 tablet (10 mg total) by mouth daily.   NIKKI 3-0.02 MG tablet Take 1 tablet by mouth daily.   REXULTI 1 MG TABS tablet Take 1 mg by mouth daily.   rosuvastatin (CRESTOR) 20 MG tablet SMARTSIG:1 Tablet(s) By Mouth Every Evening   tiZANidine (ZANAFLEX) 4 MG tablet SMARTSIG:1 Tablet(s) By Mouth  Every Evening   VYVANSE 30 MG capsule Take 30 mg by mouth 2 (two) times daily as needed.   zolpidem (AMBIEN CR) 6.25 MG CR tablet Take 6.25 mg by mouth daily.   No facility-administered medications prior to visit.    Review of Systems  Respiratory:  Negative for shortness of breath.   Cardiovascular:  Negative for chest pain and leg swelling.  Musculoskeletal:  Positive for arthralgias.  All other systems reviewed and are negative.  Last CBC Lab Results  Component Value Date   WBC 9.0 03/03/2020   HGB 13.6 03/03/2020   HCT 39.7 03/03/2020   MCV 89 03/03/2020   MCH 30.4 03/03/2020   RDW 12.4 03/03/2020   PLT 347 07/01/3006   Last metabolic panel Lab Results  Component Value Date   GLUCOSE 110 (H) 08/02/2021   NA 139  08/02/2021   K 4.2 08/02/2021   CL 102 08/02/2021   CO2 20 08/02/2021   BUN 9 08/02/2021   CREATININE 0.78 08/02/2021   EGFR 98 08/02/2021   CALCIUM 9.9 08/02/2021   PROT 7.7 08/02/2021   ALBUMIN 4.8 08/02/2021   LABGLOB 2.9 08/02/2021   AGRATIO 1.7 08/02/2021   BILITOT 0.4 08/02/2021   ALKPHOS 71 08/02/2021   AST 18 08/02/2021   ALT 25 08/02/2021   ANIONGAP 9 08/08/2017   Last lipids Lab Results  Component Value Date   CHOL 233 (H) 08/02/2021   HDL 55 08/02/2021   LDLCALC 134 (H) 08/02/2021   TRIG 250 (H) 08/02/2021   CHOLHDL 4.2 08/02/2021   Last hemoglobin A1c Lab Results  Component Value Date   HGBA1C 5.3 08/02/2021   Last thyroid functions Lab Results  Component Value Date   TSH 1.470 03/03/2020   Last vitamin D No results found for: 25OHVITD2, 25OHVITD3, VD25OH Last vitamin B12 and Folate No results found for: VITAMINB12, FOLATE     Objective    BP 126/79 (BP Location: Left Arm, Patient Position: Sitting, Cuff Size: Large)   Pulse 87   Resp 16   Ht '5\' 7"'  (1.702 m)   Wt 226 lb 3.2 oz (102.6 kg)   SpO2 99%   BMI 35.43 kg/m  BP Readings from Last 3 Encounters:  08/31/21 126/79  08/02/21 (!) 139/100  03/03/20 (!) 136/98   Wt Readings from Last 3 Encounters:  08/31/21 226 lb 3.2 oz (102.6 kg)  08/02/21 222 lb 8 oz (100.9 kg)  03/03/20 174 lb 6.4 oz (79.1 kg)      Physical Exam Constitutional:      General: She is awake.     Appearance: She is well-developed.  HENT:     Head: Normocephalic.  Eyes:     Conjunctiva/sclera: Conjunctivae normal.  Cardiovascular:     Rate and Rhythm: Normal rate and regular rhythm.     Heart sounds: Normal heart sounds.  Pulmonary:     Effort: Pulmonary effort is normal.     Breath sounds: Normal breath sounds.  Skin:    General: Skin is warm.  Neurological:     Mental Status: She is alert and oriented to person, place, and time.  Psychiatric:        Attention and Perception: Attention normal.        Mood  and Affect: Mood normal.        Speech: Speech normal.        Behavior: Behavior is cooperative.    No results found for any visits on 08/31/21.  Assessment & Plan  Problem List Items Addressed This Visit       Cardiovascular and Mediastinum   HTN (hypertension) - Primary    Successfully controlled on one agent, lisinopril 10 mg.  She does not have a reliable blood pressure machine at home, she will look into getting a new one.  Denies feeling like she has low blood pressure, denies increase in fatigue, dizziness, lightheadedness. F/u 4 mo        Respiratory   Sinus congestion    Improved azelastine/fluticasone nasal spray.      Other Visit Diagnoses     Need for influenza vaccination       Relevant Orders   Flu Vaccine QUAD 53moIM (Fluarix, Fluzone & Alfiuria Quad PF)   Right wrist pain          Right wrist pain/paresthesias May be the very beginning of carpal tunnel.  I advised if she feels an aching in her right wrist with repetitive movements or the numbness, she can try a wrist brace during these activities.  Researched images of the correct wrist brace together.  Advised to let me know if she begins feeling the symptoms at night.  At this time will not put her through an EMG until her symptoms worsen or become more frequent.  Return in about 4 months (around 12/29/2021) for hypertension, hyperlipidemia, anxiety.     I, LMikey Kirschner PA-C have reviewed all documentation for this visit. The documentation on  08/31/2021 for the exam, diagnosis, procedures, and orders are all accurate and complete.    LMikey Kirschner PA-C  [phone](phone) [phone](fax)  [place]

## 2021-08-31 ENCOUNTER — Encounter: Payer: Self-pay | Admitting: Physician Assistant

## 2021-08-31 ENCOUNTER — Ambulatory Visit: Payer: BC Managed Care – PPO | Admitting: Physician Assistant

## 2021-08-31 ENCOUNTER — Other Ambulatory Visit: Payer: Self-pay

## 2021-08-31 VITALS — BP 126/79 | HR 87 | Resp 16 | Ht 67.0 in | Wt 226.2 lb

## 2021-08-31 DIAGNOSIS — R0981 Nasal congestion: Secondary | ICD-10-CM | POA: Diagnosis not present

## 2021-08-31 DIAGNOSIS — Z23 Encounter for immunization: Secondary | ICD-10-CM

## 2021-08-31 DIAGNOSIS — I10 Essential (primary) hypertension: Secondary | ICD-10-CM | POA: Diagnosis not present

## 2021-08-31 DIAGNOSIS — M25531 Pain in right wrist: Secondary | ICD-10-CM | POA: Diagnosis not present

## 2021-08-31 NOTE — Assessment & Plan Note (Signed)
Successfully controlled on one agent, lisinopril 10 mg.  She does not have a reliable blood pressure machine at home, she will look into getting a new one.  Denies feeling like she has low blood pressure, denies increase in fatigue, dizziness, lightheadedness. F/u 4 mo

## 2021-08-31 NOTE — Assessment & Plan Note (Signed)
Improved azelastine/fluticasone nasal spray.

## 2021-10-05 LAB — HM PAP SMEAR: HM Pap smear: NEGATIVE

## 2021-10-05 LAB — HM MAMMOGRAPHY

## 2021-10-14 ENCOUNTER — Ambulatory Visit: Payer: BC Managed Care – PPO | Admitting: Physician Assistant

## 2021-10-14 ENCOUNTER — Other Ambulatory Visit: Payer: Self-pay

## 2021-10-14 ENCOUNTER — Ambulatory Visit
Admission: RE | Admit: 2021-10-14 | Discharge: 2021-10-14 | Disposition: A | Discharge status: Home / Self Care | Payer: BC Managed Care – PPO | Source: Ambulatory Visit | Attending: Physician Assistant | Admitting: Physician Assistant

## 2021-10-14 ENCOUNTER — Encounter: Payer: Self-pay | Admitting: Physician Assistant

## 2021-10-14 ENCOUNTER — Ambulatory Visit
Admission: RE | Admit: 2021-10-14 | Discharge: 2021-10-14 | Disposition: A | Discharge status: Home / Self Care | Payer: BC Managed Care – PPO | Attending: Physician Assistant | Admitting: Physician Assistant

## 2021-10-14 VITALS — BP 140/96 | HR 102 | Temp 97.4°F | Resp 16 | Wt 231.0 lb

## 2021-10-14 DIAGNOSIS — S3992XA Unspecified injury of lower back, initial encounter: Secondary | ICD-10-CM

## 2021-10-14 DIAGNOSIS — M545 Low back pain, unspecified: Secondary | ICD-10-CM

## 2021-10-14 DIAGNOSIS — M546 Pain in thoracic spine: Secondary | ICD-10-CM | POA: Insufficient documentation

## 2021-10-14 DIAGNOSIS — I10 Essential (primary) hypertension: Secondary | ICD-10-CM

## 2021-10-14 NOTE — Assessment & Plan Note (Signed)
Likely elevated secondary to pain will reeval at next follow up

## 2021-10-14 NOTE — Progress Notes (Signed)
Established patient visit   Patient: Christina [name]   DOB: 24-Jul-1981   41 y.o. Female  MRN: [id number] Visit Date: 10/14/2021  Today's healthcare provider: [name], PA-C   Chief Complaint  Patient presents with   Back Pain   Fall   Subjective      [name] is a 41 y/o who presents today post a fall down her stairs 3 weeks ago. She landed on her bottom and hit the stairs along her low and mid back. She is still having 7/10 tailbone pain and spinal pain. Denies any muscle spasms, numbness, tingling, radiating pain, hematuira, LOC or hitting head during fall. Has been taking NSAIDs, heating, and resting.   Medications: Outpatient Medications Prior to Visit  Medication Sig   Azelastine-Fluticasone 137-50 MCG/ACT SUSP Place 1 spray into the nose every 12 (twelve) hours.   cetirizine (ZYRTEC) 10 MG tablet Take 10 mg by mouth daily as needed for allergies.   cloNIDine (CATAPRES) 0.1 MG tablet SMARTSIG:1 Tablet(s) By Mouth Every Evening   escitalopram (LEXAPRO) 10 MG tablet Take 10 mg by mouth daily.   esomeprazole (NEXIUM) 40 MG capsule Take 40 mg by mouth daily at 12 noon.   FARXIGA 5 MG TABS tablet Take 5 mg by mouth daily.   fexofenadine (ALLEGRA ALLERGY) 180 MG tablet Take 1 tablet (180 mg total) by mouth daily.   HORIZANT 300 MG TBCR SMARTSIG:1 Tablet(s) By Mouth Every Evening   icosapent Ethyl (VASCEPA) 1 g capsule Take 2 g by mouth 2 (two) times daily.   lisinopril (ZESTRIL) 10 MG tablet Take 1 tablet (10 mg total) by mouth daily.   NIKKI 3-0.02 MG tablet Take 1 tablet by mouth daily.   REXULTI 1 MG TABS tablet Take 1 mg by mouth daily.   rosuvastatin (CRESTOR) 20 MG tablet SMARTSIG:1 Tablet(s) By Mouth Every Evening   tiZANidine (ZANAFLEX) 4 MG tablet SMARTSIG:1 Tablet(s) By Mouth Every Evening   VYVANSE 30 MG capsule Take 30 mg by mouth 2 (two) times daily as needed.   zolpidem (AMBIEN CR) 6.25 MG CR tablet Take 6.25 mg by mouth daily.   No  facility-administered medications prior to visit.    Review of Systems  Genitourinary:  Negative for hematuria.  Neurological:  Negative for numbness.      Objective    BP (!) 140/96 (BP Location: Left Arm, Patient Position: Sitting, Cuff Size: Large)    Pulse (!) 102    Temp (!) 97.4 F (36.3 C) (Oral)    Resp 16    Wt 231 lb (104.8 kg)    BMI 36.18 kg/m   Physical Exam Constitutional:      Appearance: She is not ill-appearing.  HENT:     Head: Normocephalic.  Eyes:     Conjunctiva/sclera: Conjunctivae normal.  Cardiovascular:     Rate and Rhythm: Normal rate.  Pulmonary:     Effort: Pulmonary effort is normal. No respiratory distress.  Musculoskeletal:     Thoracic back: Tenderness and bony tenderness present. No edema.     Lumbar back: Tenderness and bony tenderness present. No edema.     Comments: No visible ecchymosis.   Neurological:     General: No focal deficit present.     Mental Status: She is alert and oriented to person, place, and time.  Psychiatric:        Mood and Affect: Mood normal.        Behavior: Behavior normal.  No results found for any visits on 10/14/21.  Assessment & Plan     Problem List Items Addressed This Visit       Cardiovascular and Mediastinum   HTN (hypertension)    Likely elevated secondary to pain will reeval at next follow up         Other   Tailbone injury, initial encounter - Primary    Still tender along midline spine, some thoracic tenderness, mainly low lumbar and sacrum  rx xrays, advise ice and nsaids, can take up to 800 mg q 6 hrs      Relevant Orders   DG Sacrum/Coccyx   Other Visit Diagnoses     Acute midline thoracic back pain       Relevant Orders   DG Lumbar Spine Complete   DG Thoracic Spine W/Swimmers   Acute midline low back pain without sciatica       Relevant Orders   DG Lumbar Spine Complete        Return in about 2 months (around 12/12/2021) for hypertension, as scheduled.      I,  [name], PA-C have reviewed all documentation for this visit. The documentation on  10/14/2021 for the exam, diagnosis, procedures, and orders are all accurate and complete.    [name], PA-C  [facility] [phone] (phone) [phone] (fax)  [place]

## 2021-10-14 NOTE — Assessment & Plan Note (Signed)
Still tender along midline spine, some thoracic tenderness, mainly low lumbar and sacrum  rx xrays, advise ice and nsaids, can take up to 800 mg q 6 hrs

## 2021-12-01 ENCOUNTER — Other Ambulatory Visit: Payer: Self-pay | Admitting: Physician Assistant

## 2021-12-01 DIAGNOSIS — R0982 Postnasal drip: Secondary | ICD-10-CM

## 2021-12-01 DIAGNOSIS — R0981 Nasal congestion: Secondary | ICD-10-CM

## 2021-12-01 NOTE — Telephone Encounter (Signed)
Requested medication (s) are due for refill today:   Provider to review  Requested medication (s) are on the active medication list:   Yes  Future visit scheduled:   Yes   Last ordered: 08/02/2021 23g, 1 refill  Returned because it's a non delegated refill.   Requested Prescriptions  Pending Prescriptions Disp Refills   Azelastine-Fluticasone 137-50 MCG/ACT SUSP [Pharmacy Med Name: AZELASTIN-FLUTIC 137-50MCG SPR]  1    Sig: Place 1 spray into the nose every 12 (twelve) hours.     Not Delegated - Ear, Nose, and Throat: Nasal Preparations - Corticosteroids Failed - 12/01/2021  8:21 AM      Failed - This refill cannot be delegated      Passed - Valid encounter within last 12 months    Recent Outpatient Visits           1 month ago Tailbone injury, initial encounter   [facility] Thedore Mins, [place], PA-C   3 months ago Primary hypertension   [facility] Thedore Mins, [place], PA-C   4 months ago Hypertension, unspecified type   [facility] [name], PA-C   1 year ago Annual physical exam   [facility] Trinna Post, [place]   2 years ago Essential hypertension   [facility] Trinna Post, [place]       Future Appointments             In 1 month Thedore Mins, Ria Comment, PA-C [employer], [place]

## 2021-12-13 ENCOUNTER — Other Ambulatory Visit: Payer: Self-pay | Admitting: Physician Assistant

## 2021-12-13 DIAGNOSIS — R0982 Postnasal drip: Secondary | ICD-10-CM

## 2021-12-13 DIAGNOSIS — R0981 Nasal congestion: Secondary | ICD-10-CM

## 2022-01-05 ENCOUNTER — Ambulatory Visit: Payer: BC Managed Care – PPO | Admitting: Physician Assistant

## 2022-01-06 ENCOUNTER — Ambulatory Visit: Payer: BC Managed Care – PPO | Admitting: Physician Assistant

## 2022-01-17 ENCOUNTER — Encounter: Payer: Self-pay | Admitting: Physician Assistant

## 2022-01-17 ENCOUNTER — Ambulatory Visit: Payer: BC Managed Care – PPO | Admitting: Physician Assistant

## 2022-01-17 VITALS — BP 133/83 | HR 82 | Ht 67.0 in | Wt 222.7 lb

## 2022-01-17 DIAGNOSIS — I10 Essential (primary) hypertension: Secondary | ICD-10-CM | POA: Diagnosis not present

## 2022-01-17 DIAGNOSIS — E782 Mixed hyperlipidemia: Secondary | ICD-10-CM

## 2022-01-17 DIAGNOSIS — R200 Anesthesia of skin: Secondary | ICD-10-CM | POA: Diagnosis not present

## 2022-01-17 NOTE — Assessment & Plan Note (Signed)
May be an element of carpal tunnel--recommended wrist braces at night and with symptoms during the day. ?If no improvement would refer to ortho for EMG. ?

## 2022-01-17 NOTE — Assessment & Plan Note (Signed)
Mildly elevated in office, likely element of white coat HTN ?Advised pt to check 1-2 times a week at home and record. ?No changes today continue lisinopril 10 mg ?

## 2022-01-17 NOTE — Progress Notes (Signed)
? ?Established Patient Office Visit ? ?Subjective:  ?Patient ID: Christina [name], female    DOB: Jan 07, 1981  Age: 41 y.o. MRN: [id number] ? ?CC:  ?Chief Complaint  ?Patient presents with  ? Hyperlipidemia  ? Hypertension  ? ? ?Reports b/l hand tingling/numbness on occasion when she reaches for something. She will sometimes hear a pop and feel numbness, but eventually sensation comes back. Denies pain.  ? ?HPI ? ?Lipid/Cholesterol, Follow-up ? ?Last lipid panel Other pertinent labs  ?Lab Results  ?Component Value Date  ? CHOL 233 (H) 08/02/2021  ? HDL 55 08/02/2021  ? LDLCALC 134 (H) 08/02/2021  ? TRIG 250 (H) 08/02/2021  ? CHOLHDL 4.2 08/02/2021  ? Lab Results  ?Component Value Date  ? ALT 25 08/02/2021  ? AST 18 08/02/2021  ? PLT 347 03/03/2020  ? TSH 1.470 03/03/2020  ?  ? ?Was taking vascepa and crestor, but we d/c. Last lipid panel was unmedicated.  ? ?She reports good compliance with treatment. ?She is not having side effects.  ? ?The 10-year ASCVD risk score (Arnett DK, et al., 2019) is: 1.1% ? ?---------------------------------------------------------------------------------------------------  ?Hypertension, follow-up ? ?BP Readings from Last 3 Encounters:  ?01/17/22 133/83  ?10/14/21 (!) 140/96  ?08/31/21 126/79  ? Wt Readings from Last 3 Encounters:  ?01/17/22 222 lb 11.2 oz (101 kg)  ?10/14/21 231 lb (104.8 kg)  ?08/31/21 226 lb 3.2 oz (102.6 kg)  ?  ? ?Managed with lisinopril 10 mg. ? ?She reports good compliance with treatment. ?She is not having side effects.  ?She is following a Regular diet. ?She is not exercising. ?She does not smoke. ? ?Use of agents associated with hypertension: none.  ? ?Outside blood pressures are not being checked. ?Symptoms: ?No chest pain No chest pressure  ?No palpitations No syncope  ?No dyspnea No orthopnea  ?No paroxysmal nocturnal dyspnea No lower extremity edema  ? ?Pertinent labs ?Lab Results  ?Component Value Date  ? CHOL 233 (H) 08/02/2021  ? HDL 55 08/02/2021   ? LDLCALC 134 (H) 08/02/2021  ? TRIG 250 (H) 08/02/2021  ? CHOLHDL 4.2 08/02/2021  ? Lab Results  ?Component Value Date  ? NA 139 08/02/2021  ? K 4.2 08/02/2021  ? CREATININE 0.78 08/02/2021  ? EGFR 98 08/02/2021  ? GLUCOSE 110 (H) 08/02/2021  ? TSH 1.470 03/03/2020  ?  ? ?The 10-year ASCVD risk score (Arnett DK, et al., 2019) is: 1.1% ? ?---------------------------------------------------------------------------------------------------  ? ?Past Medical History:  ?Diagnosis Date  ? Allergy   ? Anemia   ? Anxiety   ? GERD (gastroesophageal reflux disease)   ? Hyperlipidemia   ? Hypertension   ? Medical history non-contributory   ? Postpartum care following vaginal delivery (5/5) 02/11/2016  ? Pregnancy induced hypertension   ? Preterm labor   ? Second degree perineal laceration 08/09/2017  ? Vaginal Pap smear, abnormal   ? ? ?Past Surgical History:  ?Procedure Laterality Date  ? MANDIBLE SURGERY    ? ? ?Family History  ?Problem Relation Age of Onset  ? Hyperlipidemia Mother   ? Hypertension Mother   ? Cataracts Mother   ? Hypertension Father   ? Hyperlipidemia Father   ? Anemia Father   ? Hypertension Brother   ? Hyperlipidemia Brother   ? Hypertension Maternal Grandmother   ? Hyperlipidemia Maternal Grandmother   ? Dementia Maternal Grandmother   ? Colon cancer Maternal Grandfather   ? Hypertension Paternal Grandmother   ? Hyperlipidemia Paternal Grandmother   ?  Rheum arthritis Neg Hx   ? Osteoarthritis Neg Hx   ? ? ?Social History  ? ?Socioeconomic History  ? Marital status: Married  ?  Spouse name: Not on file  ? Number of children: Not on file  ? Years of education: Not on file  ? Highest education level: Not on file  ?Occupational History  ? Not on file  ?Tobacco Use  ? Smoking status: Never  ? Smokeless tobacco: Never  ?Vaping Use  ? Vaping Use: Never used  ?Substance and Sexual Activity  ? Alcohol use: No  ? Drug use: No  ? Sexual activity: Yes  ?Other Topics Concern  ? Not on file  ?Social History Narrative  ?  Not on file  ? ?Social Determinants of Health  ? ?Financial Resource Strain: Not on file  ?Food Insecurity: Not on file  ?Transportation Needs: Not on file  ?Physical Activity: Not on file  ?Stress: Not on file  ?Social Connections: Not on file  ?Intimate Partner Violence: Not on file  ? ? ?Outpatient Medications Prior to Visit  ?Medication Sig Dispense Refill  ? Azelastine-Fluticasone 137-50 MCG/ACT SUSP PLACE 1 SPRAY INTO THE NOSE EVERY 12 (TWELVE) HOURS. 23 g 5  ? escitalopram (LEXAPRO) 10 MG tablet Take 10 mg by mouth daily.    ? esomeprazole (NEXIUM) 40 MG capsule Take 40 mg by mouth daily at 12 noon.    ? fexofenadine (ALLEGRA) 180 MG tablet TAKE 1 TABLET BY MOUTH EVERY DAY 90 tablet 1  ? guanFACINE (INTUNIV) 1 MG TB24 ER tablet SMARTSIG:1 Tablet(s) By Mouth Every Evening    ? lisinopril (ZESTRIL) 10 MG tablet Take 1 tablet (10 mg total) by mouth daily. 90 tablet 3  ? tiZANidine (ZANAFLEX) 4 MG tablet SMARTSIG:1 Tablet(s) By Mouth Every Evening    ? zolpidem (AMBIEN) 10 MG tablet Take 10 mg by mouth at bedtime as needed.    ? HORIZANT 300 MG TBCR SMARTSIG:1 Tablet(s) By Mouth Every Evening    ? NIKKI 3-0.02 MG tablet Take 1 tablet by mouth daily.    ? REXULTI 1 MG TABS tablet Take 1 mg by mouth daily.    ? VYVANSE 30 MG capsule Take 30 mg by mouth 2 (two) times daily as needed.    ? VYVANSE 60 MG CHEW Chew 0.5 tablets by mouth every morning.    ? cetirizine (ZYRTEC) 10 MG tablet Take 10 mg by mouth daily as needed for allergies. (Patient not taking: Reported on 01/17/2022)    ? cloNIDine (CATAPRES) 0.1 MG tablet SMARTSIG:1 Tablet(s) By Mouth Every Evening (Patient not taking: Reported on 01/17/2022)    ? FARXIGA 5 MG TABS tablet Take 5 mg by mouth daily.    ? icosapent Ethyl (VASCEPA) 1 g capsule Take 2 g by mouth 2 (two) times daily.    ? labetalol (NORMODYNE) 100 MG tablet Take 100 mg by mouth 2 (two) times daily.    ? metroNIDAZOLE (FLAGYL) 500 MG tablet Take 500 mg by mouth every 8 (eight) hours.    ?  rosuvastatin (CRESTOR) 20 MG tablet SMARTSIG:1 Tablet(s) By Mouth Every Evening (Patient not taking: Reported on 01/17/2022)    ? zolpidem (AMBIEN CR) 6.25 MG CR tablet Take 6.25 mg by mouth daily. (Patient not taking: Reported on 01/17/2022)    ? ?No facility-administered medications prior to visit.  ? ? ?Allergies  ?Allergen Reactions  ? Erythromycin Nausea And Vomiting  ? ? ?ROS ?Review of Systems  ?Constitutional:  Negative for fatigue and  fever.  ?Respiratory:  Negative for cough and shortness of breath.   ?Cardiovascular:  Negative for chest pain and leg swelling.  ?Gastrointestinal:  Negative for abdominal pain.  ?Neurological:  Negative for dizziness and headaches.  ? ?  ?Objective:  ?  ?Physical Exam ?Constitutional:   ?   Appearance: Normal appearance. She is not ill-appearing.  ?HENT:  ?   Head: Normocephalic.  ?Eyes:  ?   Conjunctiva/sclera: Conjunctivae normal.  ?Cardiovascular:  ?   Rate and Rhythm: Normal rate and regular rhythm.  ?   Pulses: Normal pulses.  ?   Heart sounds: Normal heart sounds.  ?Pulmonary:  ?   Effort: Pulmonary effort is normal.  ?   Breath sounds: Normal breath sounds.  ?Musculoskeletal:  ?   Right lower leg: No edema.  ?   Left lower leg: No edema.  ?   Comments: Negative phalen's test  ?Neurological:  ?   Mental Status: She is oriented to person, place, and time.  ?Psychiatric:     ?   Mood and Affect: Mood normal.     ?   Behavior: Behavior normal.  ? ? ?BP 133/83 (BP Location: Right Arm, Patient Position: Sitting, Cuff Size: Large)   Pulse 82   Ht _0  (1.702 m)   Wt 222 lb 11.2 oz (101 kg)   SpO2 98%   BMI 34.88 kg/m?  ?Wt Readings from Last 3 Encounters:  ?01/17/22 222 lb 11.2 oz (101 kg)  ?10/14/21 231 lb (104.8 kg)  ?08/31/21 226 lb 3.2 oz (102.6 kg)  ? ? ? ?Health Maintenance Due  ?Topic Date Due  ? Hepatitis C Screening  Never done  ? TETANUS/TDAP  Never done  ? PAP SMEAR-Modifier  09/18/2020  ? ? ?There are no preventive care reminders to display for this  patient. ? ?Lab Results  ?Component Value Date  ? TSH 1.470 03/03/2020  ? ?Lab Results  ?Component Value Date  ? WBC 9.0 03/03/2020  ? HGB 13.6 03/03/2020  ? HCT 39.7 03/03/2020  ? MCV 89 03/03/2020  ? PLT 347 03/03/2020

## 2022-01-17 NOTE — Assessment & Plan Note (Signed)
Will recheck for stability.Currently unmedicated. ?Advised if significantly elevated LDL and total cholesterol, would restart statin.  ?The 10-year ASCVD risk score (Arnett DK, et al., 2019) is: 1.1% ? ?

## 2022-01-17 NOTE — Patient Instructions (Signed)
Christina [name] Brace for carpal tunnel  ?

## 2022-01-18 LAB — COMPREHENSIVE METABOLIC PANEL
ALT: 12 IU/L (ref 0–32)
AST: 13 IU/L (ref 0–40)
Albumin/Globulin Ratio: 1.5 (ref 1.2–2.2)
Albumin: 4.4 g/dL (ref 3.8–4.8)
Alkaline Phosphatase: 68 IU/L (ref 44–121)
BUN/Creatinine Ratio: 15 (ref 9–23)
BUN: 11 mg/dL (ref 6–24)
Bilirubin Total: 0.2 mg/dL (ref 0.0–1.2)
CO2: 22 mmol/L (ref 20–29)
Calcium: 9.4 mg/dL (ref 8.7–10.2)
Chloride: 103 mmol/L (ref 96–106)
Creatinine, Ser: 0.74 mg/dL (ref 0.57–1.00)
Globulin, Total: 2.9 g/dL (ref 1.5–4.5)
Glucose: 107 mg/dL — ABNORMAL HIGH (ref 70–99)
Potassium: 4.4 mmol/L (ref 3.5–5.2)
Sodium: 140 mmol/L (ref 134–144)
Total Protein: 7.3 g/dL (ref 6.0–8.5)
eGFR: 105 mL/min/{1.73_m2} (ref >59)

## 2022-01-18 LAB — LIPID PANEL
Chol/HDL Ratio: 3.5 ratio (ref 0.0–4.4)
Cholesterol, Total: 237 mg/dL — ABNORMAL HIGH (ref 100–199)
HDL: 67 mg/dL (ref >39)
LDL Chol Calc (NIH): 125 mg/dL — ABNORMAL HIGH (ref 0–99)
Triglycerides: 256 mg/dL — ABNORMAL HIGH (ref 0–149)
VLDL Cholesterol Cal: 45 mg/dL — ABNORMAL HIGH (ref 5–40)

## 2022-07-10 ENCOUNTER — Other Ambulatory Visit: Payer: Self-pay | Admitting: Physician Assistant

## 2022-07-10 DIAGNOSIS — I10 Essential (primary) hypertension: Secondary | ICD-10-CM

## 2022-07-19 ENCOUNTER — Encounter: Payer: Self-pay | Admitting: Physician Assistant

## 2022-07-19 ENCOUNTER — Ambulatory Visit (INDEPENDENT_AMBULATORY_CARE_PROVIDER_SITE_OTHER): Payer: BC Managed Care – PPO | Admitting: Physician Assistant

## 2022-07-19 VITALS — BP 129/84 | HR 89 | Temp 98.3°F | Resp 14 | Ht 67.0 in | Wt 217.0 lb

## 2022-07-19 DIAGNOSIS — R739 Hyperglycemia, unspecified: Secondary | ICD-10-CM | POA: Diagnosis not present

## 2022-07-19 DIAGNOSIS — I10 Essential (primary) hypertension: Secondary | ICD-10-CM

## 2022-07-19 DIAGNOSIS — J0110 Acute frontal sinusitis, unspecified: Secondary | ICD-10-CM

## 2022-07-19 DIAGNOSIS — E782 Mixed hyperlipidemia: Secondary | ICD-10-CM

## 2022-07-19 DIAGNOSIS — Z0000 Encounter for general adult medical examination without abnormal findings: Secondary | ICD-10-CM | POA: Diagnosis not present

## 2022-07-19 IMAGING — CR DG LUMBAR SPINE COMPLETE 4+V
1 series · 4 of 4 positions shown · non-contrast
Comparison: None.

CLINICAL DATA: Low back pain after fall.

EXAM:
LUMBAR SPINE - COMPLETE 4+ VIEW

[Series 1: dg lumbar spine complete 4 +v · 0.14mm/px · 4 of 4 slices shown]
[im 1/4]
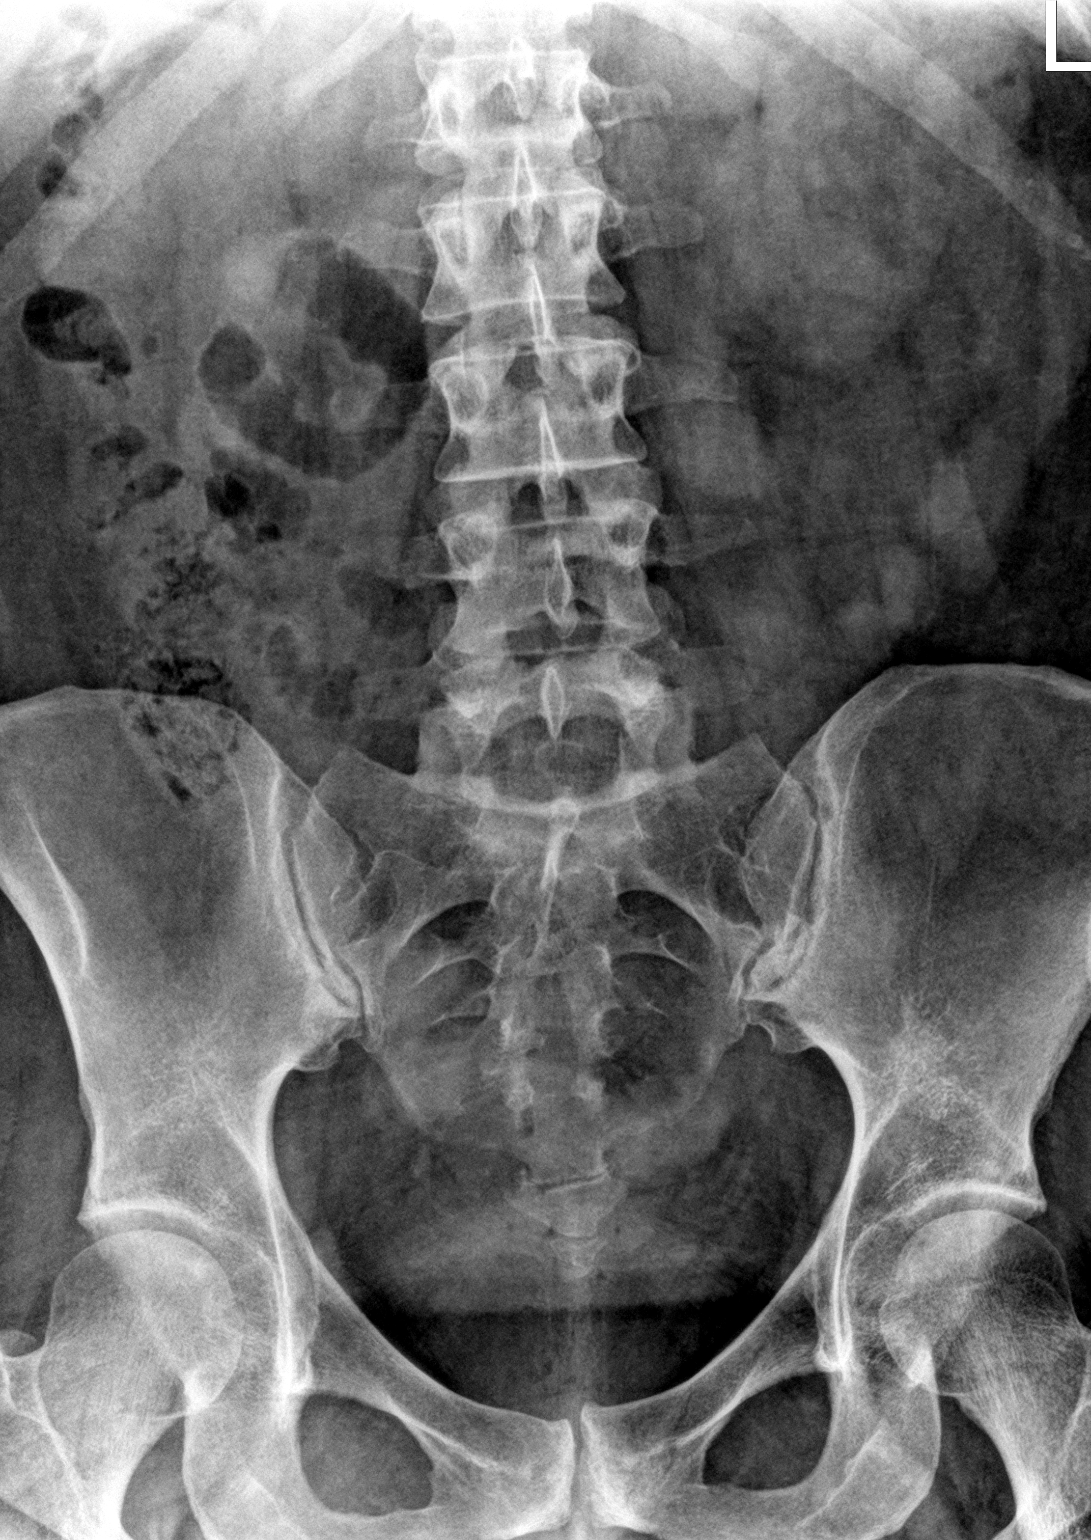
[im 2/4]
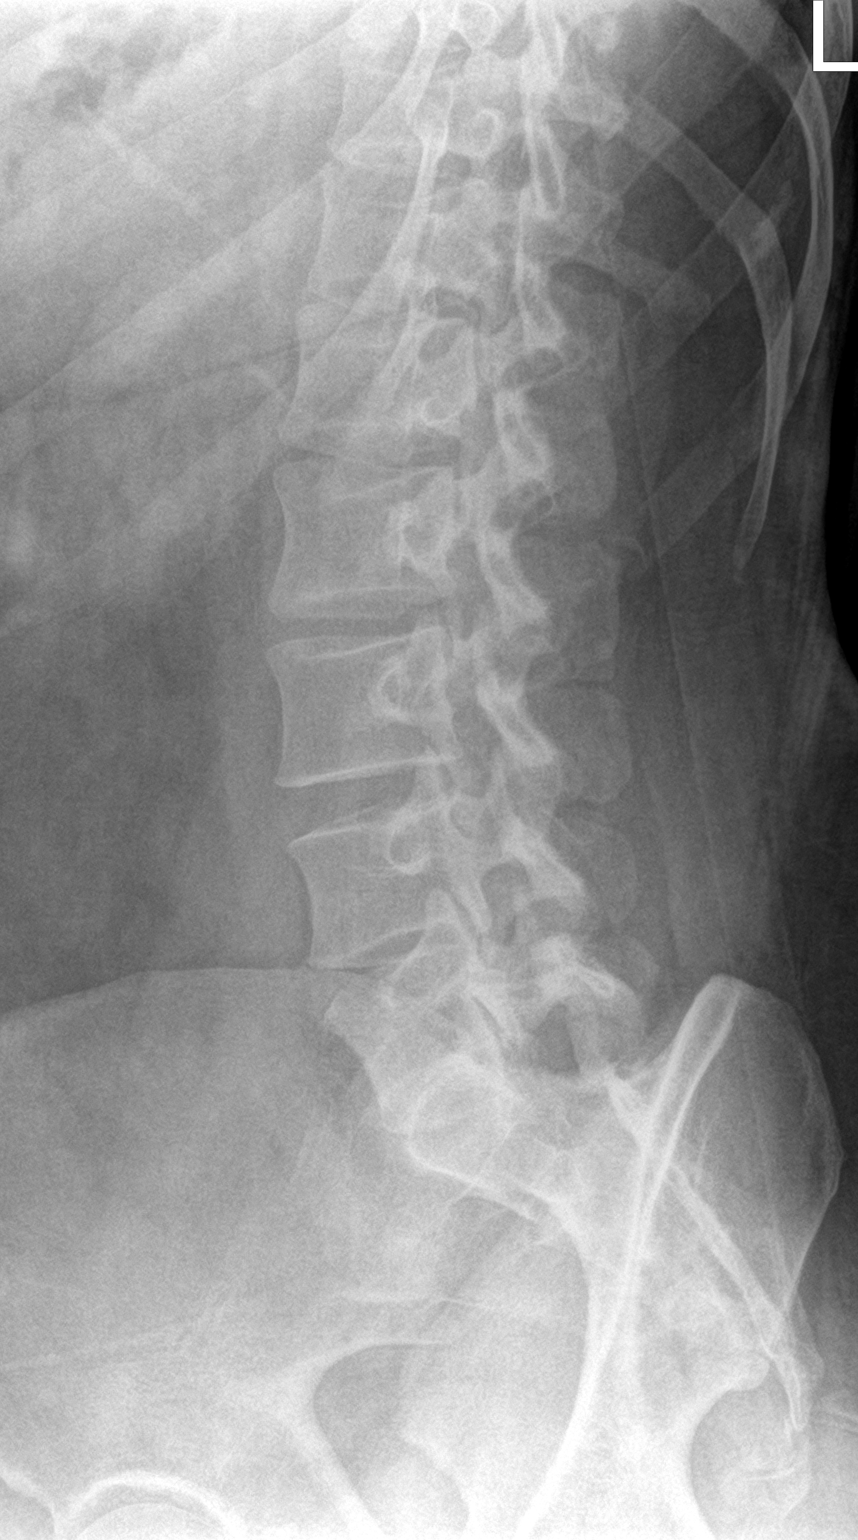
[im 3/4]
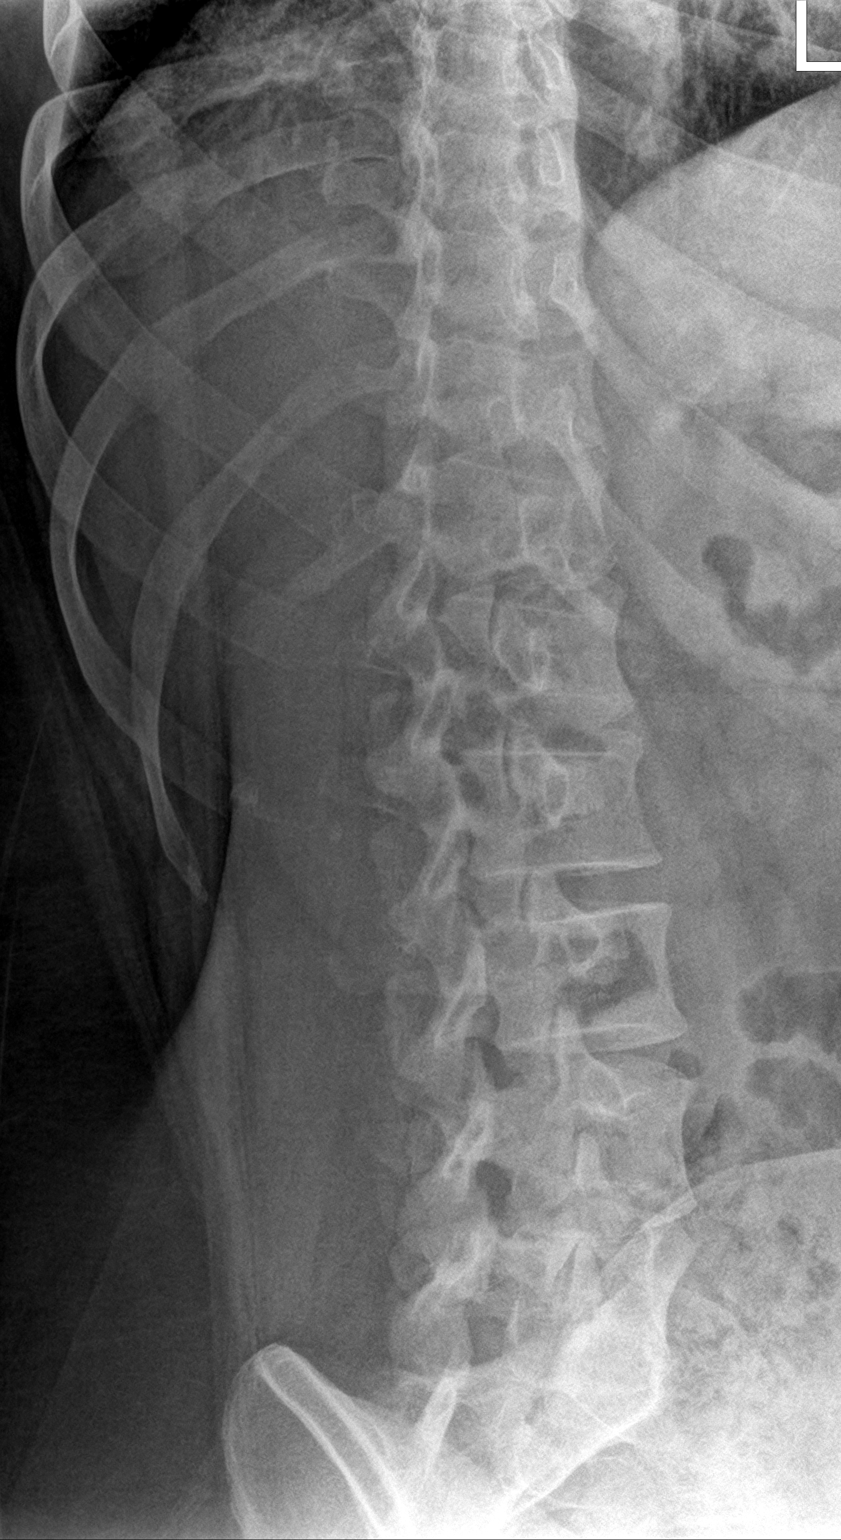
[im 4/4]
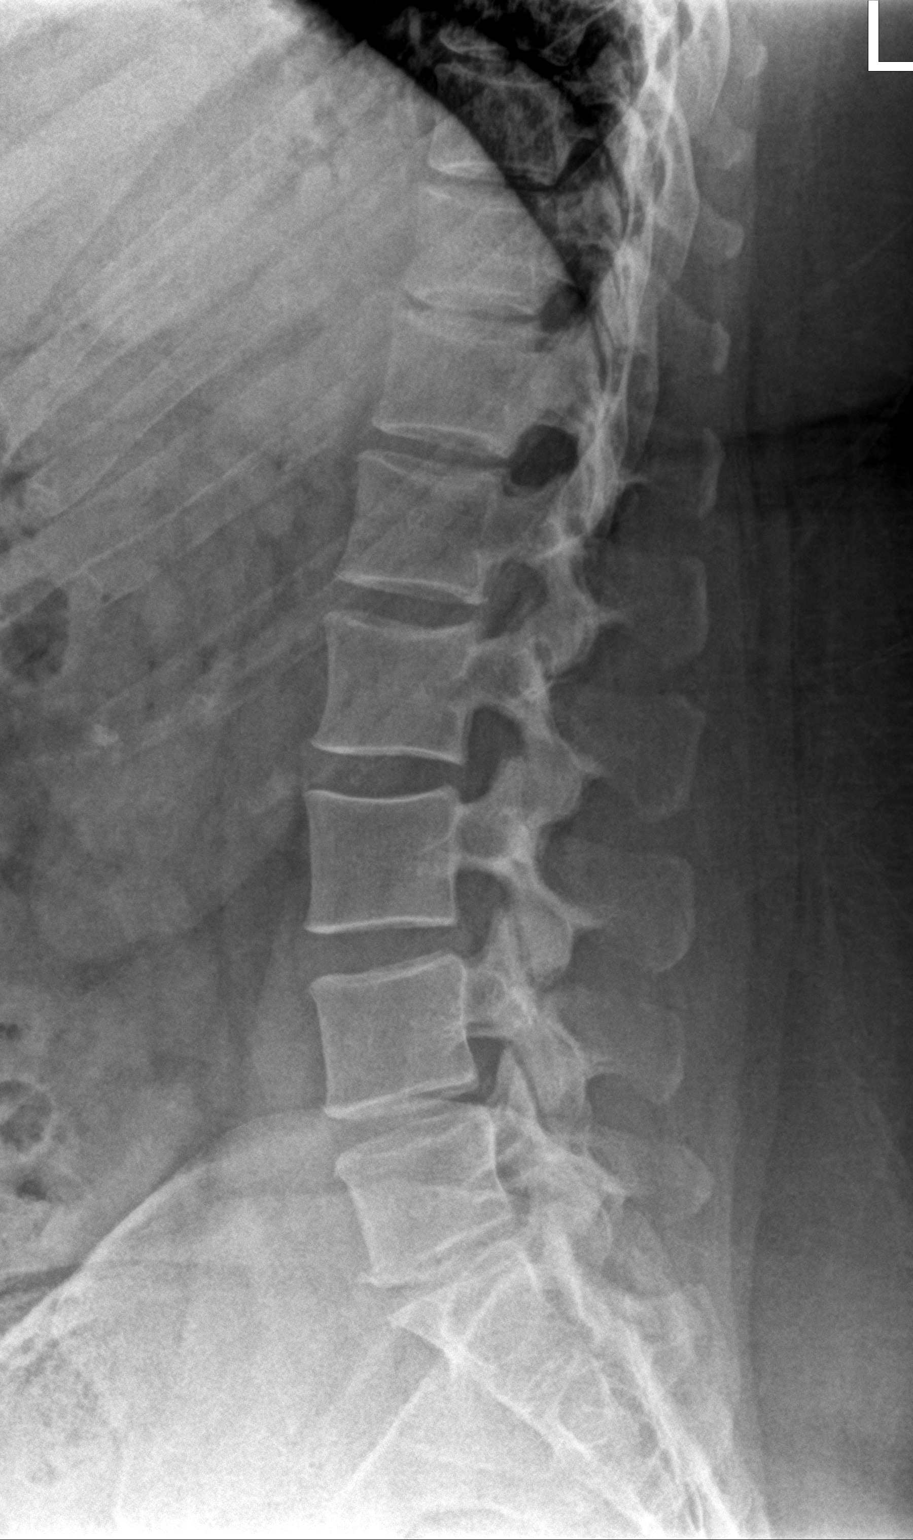

[4 of 4 positions shown; findings below may reference images not displayed]

FINDINGS: Five lumbar type vertebral bodies.

No acute fracture or subluxation. Vertebral body heights are
preserved.

Alignment is normal.

Mild disc height loss at L4-L5.  Moderate disc height loss at L5-S1.

The sacroiliac joints are unremarkable.
IMPRESSION: 1. No acute osseous abnormality.
2. Mild L4-L5 and moderate L5-S1 degenerative disc disease.

## 2022-07-19 IMAGING — CR DG THORACIC SPINE 3V
1 series · 3 of 3 positions shown · non-contrast
Comparison: Chest x-ray dated April 19, 2021.

CLINICAL DATA: Back pain after fall.

EXAM:
THORACIC SPINE - 3 VIEWS

[Series 1: dg thoracic spine w/swimmers · 0.14mm/px · 3 of 3 slices shown]
[im 1/3]
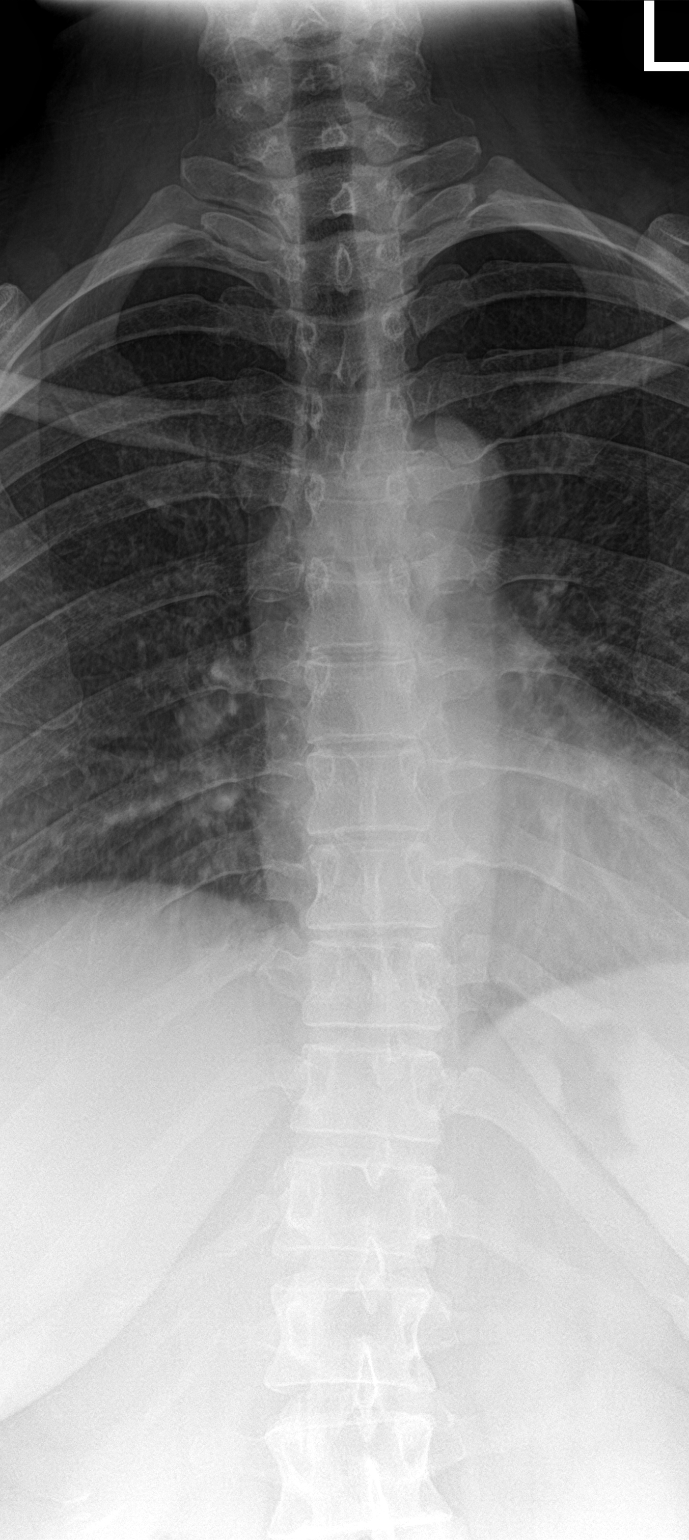
[im 2/3]
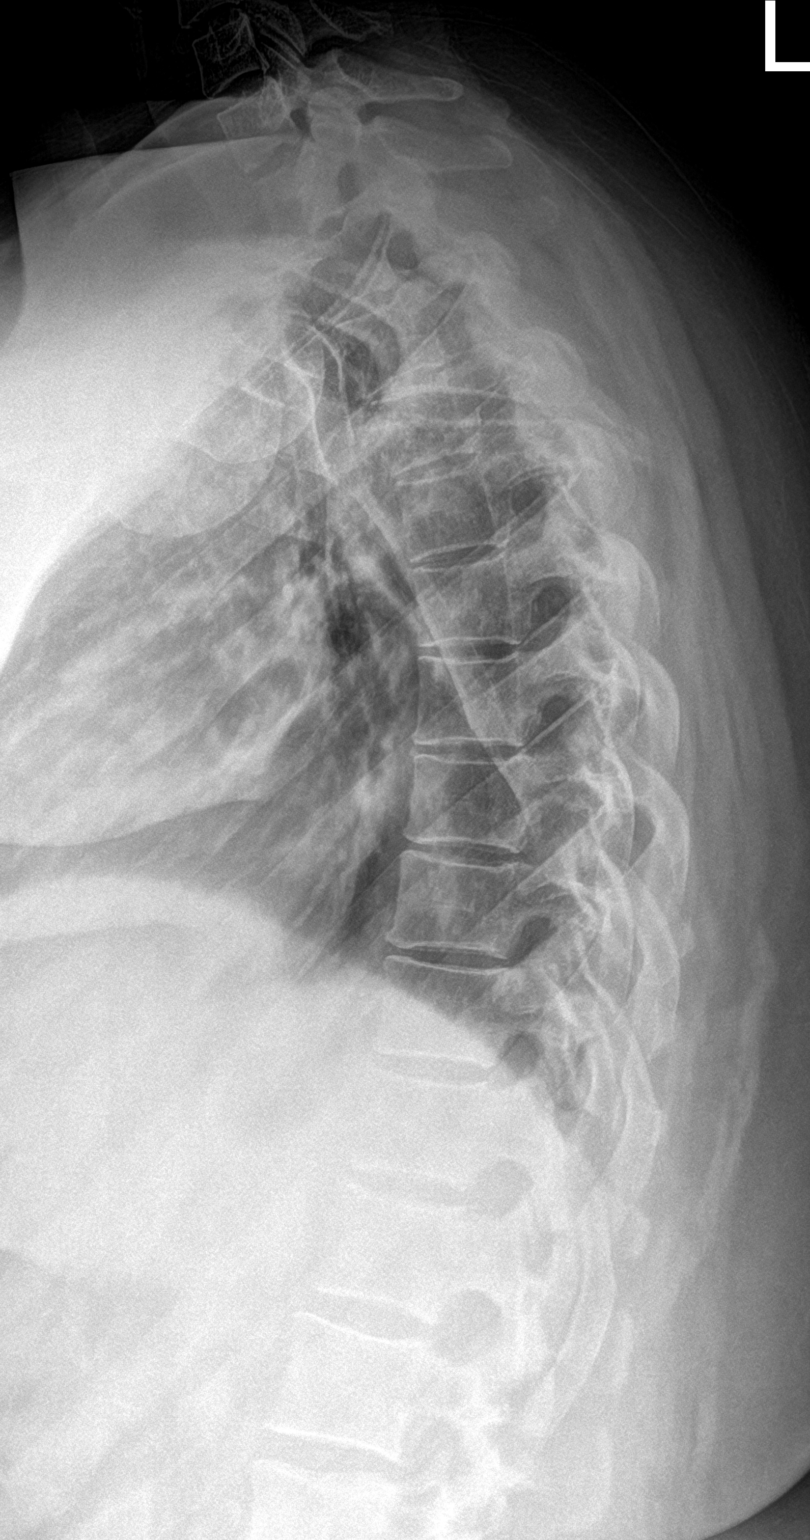
[im 3/3]
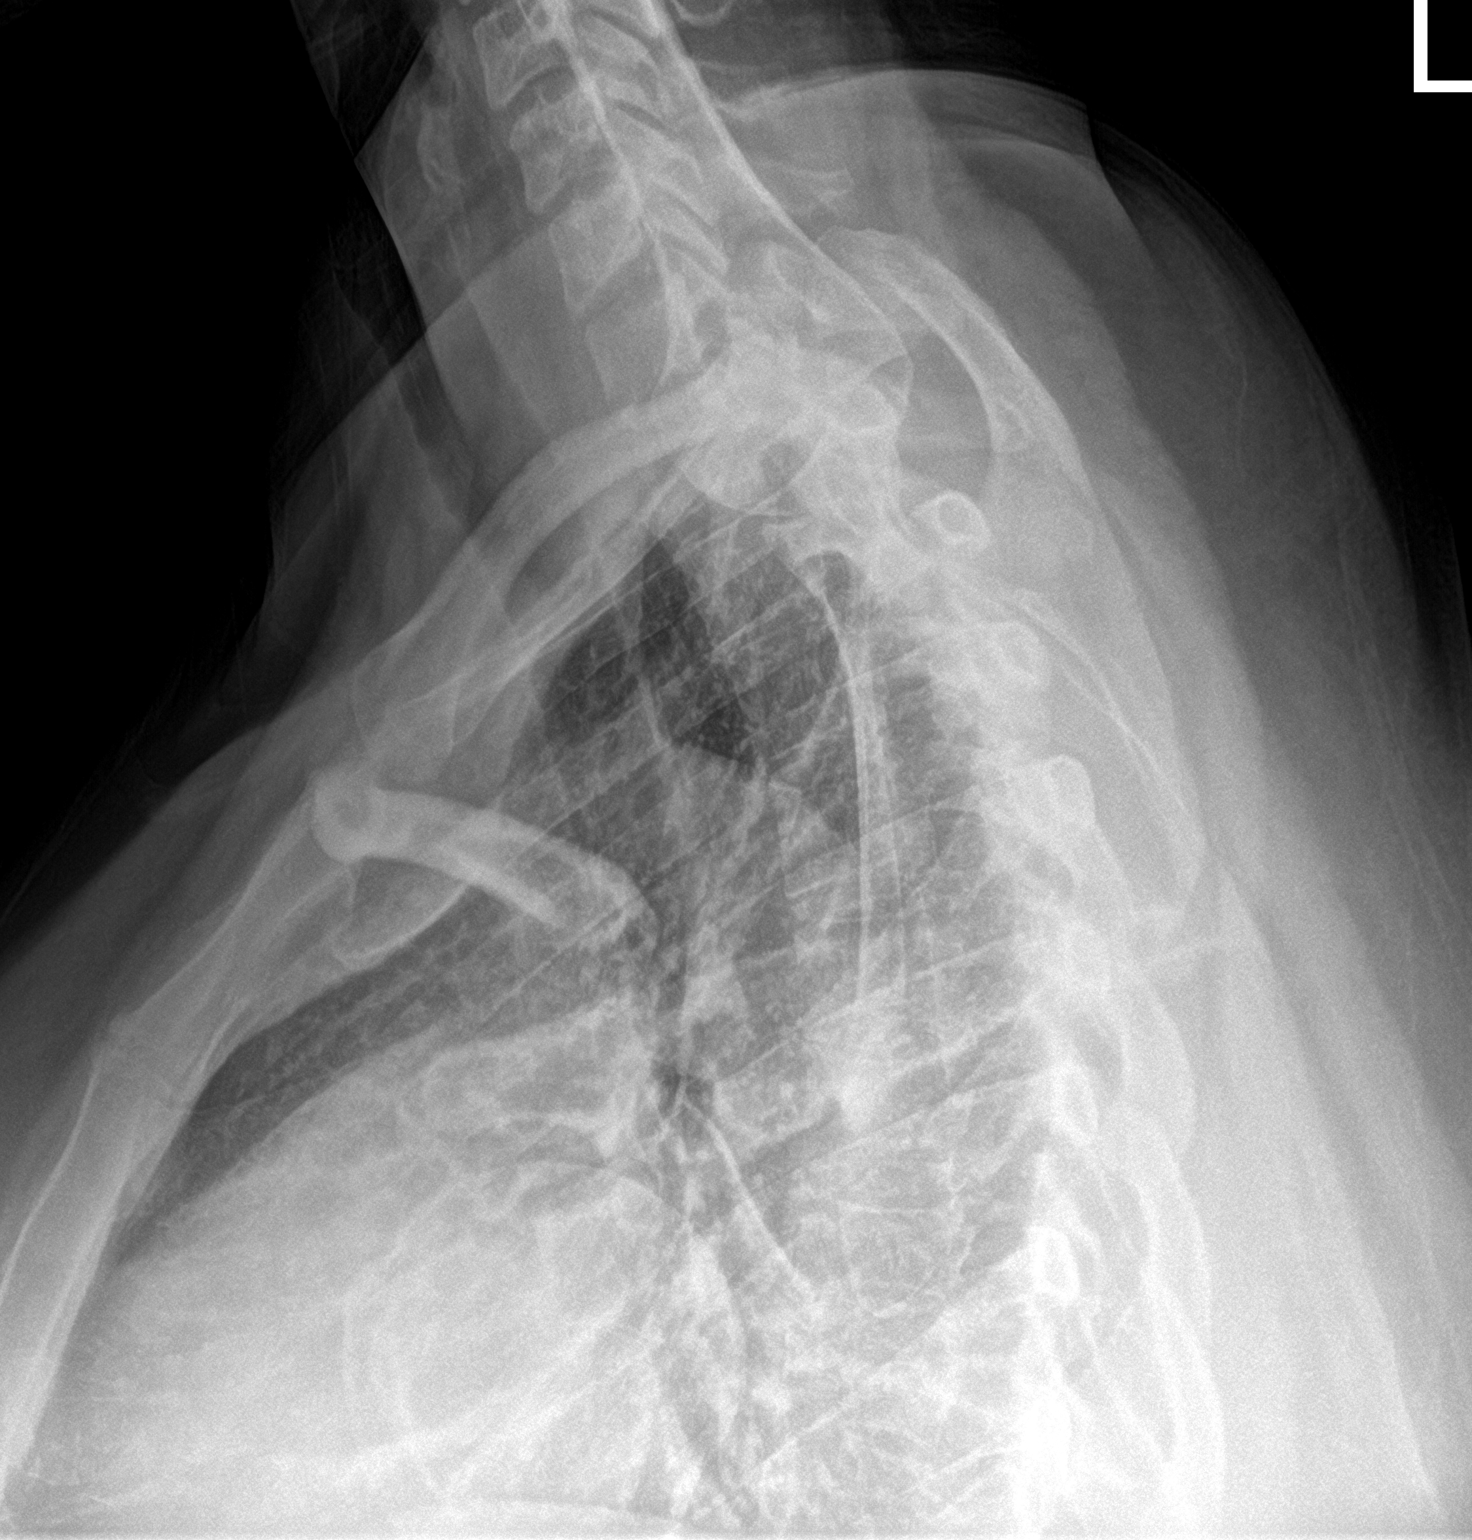

[3 of 3 positions shown; findings below may reference images not displayed]

FINDINGS: Twelve rib-bearing thoracic vertebral bodies.

No acute fracture or subluxation. Vertebral body heights are
preserved.

Alignment is normal. Intervertebral disc spaces are maintained.
IMPRESSION: Negative.

## 2022-07-19 MED ORDER — AMOXICILLIN 875 MG PO TABS: 875.0000 mg | ORAL_TABLET | Freq: Two times a day (BID) | ORAL | 0 refills | Status: AC

## 2022-07-19 NOTE — Assessment & Plan Note (Signed)
Controlled in office continue medications, lisinopril 10 mg  Ordered cmp  F/u 6 mo

## 2022-07-19 NOTE — Progress Notes (Signed)
I,Christina [name],acting as a scribe for [employer], PA-C.,have documented all relevant documentation on the behalf of Christina Ferguson, PA-C,as directed by  Christina Ferguson, PA-C while in the presence of Christina Ferguson, PA-C.   Complete physical exam   Patient: Christina [name]   DOB: 1981-03-15   41 y.o. Female  MRN: [id number] Visit Date: 07/19/2022  Today's healthcare provider: Alfredia Ferguson, PA-C   Chief Complaint  Patient presents with   Annual Exam   Subjective    Christina [name] is a 41 y.o. female who presents today for a complete physical exam.  She reports consuming a general diet. The patient does not participate in regular exercise at present. She generally feels fairly well. She reports sleeping poorly. She does have additional problems to discuss today.  HPI  Pt reports a fall down wood stairs while wearing socks 9/30, hit her back, denies back pain but since then has experienced diarrhea 2-3 times a day. Reports taking immodium this past weekend which helped but as soon as she stopped the diarrhea returned. She reports associated sinus pain, pressure, headaches, sore throat for the last 2 weeks.   Past Medical History:  Diagnosis Date   Allergy    Anemia    Anxiety    GERD (gastroesophageal reflux disease)    Hyperlipidemia    Hypertension    Medical history non-contributory    Postpartum care following vaginal delivery (5/5) 02/11/2016   Pregnancy induced hypertension    Preterm labor    Second degree perineal laceration 08/09/2017   Vaginal Pap smear, abnormal    Past Surgical History:  Procedure Laterality Date   MANDIBLE SURGERY     Social History   Socioeconomic History   Marital status: Married    Spouse name: Not on file   Number of children: Not on file   Years of education: Not on file   Highest education level: Not on file  Occupational History   Not on file  Tobacco Use   Smoking status: Never   Smokeless tobacco:  Never  Vaping Use   Vaping Use: Never used  Substance and Sexual Activity   Alcohol use: No   Drug use: No   Sexual activity: Yes  Other Topics Concern   Not on file  Social History Narrative   Not on file   Social Determinants of Health   Financial Resource Strain: Not on file  Food Insecurity: Not on file  Transportation Needs: Not on file  Physical Activity: Not on file  Stress: Not on file  Social Connections: Not on file  Intimate Partner Violence: Not on file   Family Status  Relation Name Status   Mother  Alive   Father  Alive   Brother  Alive   MGM  (Not Specified)   MGF  (Not Specified)   PGM  (Not Specified)   Neg Hx  (Not Specified)   Family History  Problem Relation Age of Onset   Hyperlipidemia Mother    Hypertension Mother    Cataracts Mother    Hypertension Father    Hyperlipidemia Father    Anemia Father    Hypertension Brother    Hyperlipidemia Brother    Hypertension Maternal Grandmother    Hyperlipidemia Maternal Grandmother    Dementia Maternal Grandmother    Colon cancer Maternal Grandfather    Hypertension Paternal Grandmother    Hyperlipidemia Paternal Grandmother    Rheum arthritis Neg Hx    Osteoarthritis Neg Hx  Allergies  Allergen Reactions   Erythromycin Nausea And Vomiting    Patient Care Team: Christina Ferguson, PA-C as PCP - General (Physician Assistant) Olivia Mackie, MD as Consulting Physician (Obstetrics and Gynecology)   Medications: Outpatient Medications Prior to Visit  Medication Sig   Azelastine-Fluticasone 137-50 MCG/ACT SUSP PLACE 1 SPRAY INTO THE NOSE EVERY 12 (TWELVE) HOURS.   escitalopram (LEXAPRO) 10 MG tablet Take 15 mg by mouth daily.   esomeprazole (NEXIUM) 40 MG capsule Take 40 mg by mouth daily at 12 noon.   fexofenadine (ALLEGRA) 180 MG tablet TAKE 1 TABLET BY MOUTH EVERY DAY   guanFACINE (INTUNIV) 1 MG TB24 ER tablet SMARTSIG:1 Tablet(s) By Mouth Every Evening   HORIZANT 600 MG TBCR Take 1 tablet  by mouth daily.   lisinopril (ZESTRIL) 10 MG tablet TAKE 1 TABLET BY MOUTH EVERY DAY   NIKKI 3-0.02 MG tablet Take 1 tablet by mouth daily.   tiZANidine (ZANAFLEX) 4 MG tablet Take 8 mg by mouth at bedtime.   VYVANSE 30 MG capsule Take 30 mg by mouth daily.   zolpidem (AMBIEN) 10 MG tablet Take 10 mg by mouth at bedtime as needed.   [DISCONTINUED] HORIZANT 300 MG TBCR SMARTSIG:1 Tablet(s) By Mouth Every Evening   [DISCONTINUED] REXULTI 1 MG TABS tablet Take 1 mg by mouth daily. (Patient not taking: Reported on 07/19/2022)   [DISCONTINUED] VYVANSE 60 MG CHEW Chew 0.5 tablets by mouth every morning.   No facility-administered medications prior to visit.    Review of Systems  Constitutional:  Negative for chills, fatigue and fever.  HENT:  Positive for congestion, postnasal drip, sinus pressure and tinnitus. Negative for ear pain, rhinorrhea, sneezing and sore throat.   Eyes: Negative.  Negative for pain and redness.  Respiratory:  Negative for cough, shortness of breath and wheezing.   Cardiovascular:  Negative for chest pain and leg swelling.  Gastrointestinal:  Negative for abdominal pain, blood in stool, constipation, diarrhea and nausea.  Endocrine: Negative for polydipsia and polyphagia.  Genitourinary: Negative.  Negative for dysuria, flank pain, hematuria, pelvic pain, vaginal bleeding and vaginal discharge.  Musculoskeletal:  Positive for myalgias. Negative for arthralgias, back pain, gait problem and joint swelling.  Skin:  Negative for rash.  Neurological: Negative.  Negative for dizziness, tremors, seizures, weakness, light-headedness, numbness and headaches.  Hematological:  Negative for adenopathy.  Psychiatric/Behavioral:  Positive for sleep disturbance. Negative for behavioral problems, confusion and dysphoric mood. The patient is nervous/anxious. The patient is not hyperactive.      Objective    BP 129/84 (BP Location: Right Arm, Patient Position: Sitting, Cuff Size:  Large)   Pulse 89   Temp 98.3 F (36.8 C) (Oral)   Resp 14   Ht 5\' 7"  (1.702 m)   Wt 217 lb (98.4 kg)   LMP 06/22/2022   SpO2 99% Comment: room air  BMI 33.99 kg/m    Physical Exam Constitutional:      General: She is awake.     Appearance: She is well-developed. She is not ill-appearing.  HENT:     Head: Normocephalic.     Right Ear: Tympanic membrane normal.     Left Ear: Tympanic membrane normal.     Nose: Nose normal. No congestion or rhinorrhea.     Mouth/Throat:     Pharynx: No oropharyngeal exudate or posterior oropharyngeal erythema.  Eyes:     Conjunctiva/sclera: Conjunctivae normal.     Pupils: Pupils are equal, round, and reactive to light.  Neck:  Thyroid: No thyroid mass or thyromegaly.  Cardiovascular:     Rate and Rhythm: Normal rate and regular rhythm.     Heart sounds: Normal heart sounds.  Pulmonary:     Effort: Pulmonary effort is normal.     Breath sounds: Normal breath sounds.  Abdominal:     Palpations: Abdomen is soft.     Tenderness: There is no abdominal tenderness.  Musculoskeletal:     Right lower leg: No swelling. No edema.     Left lower leg: No swelling. No edema.  Lymphadenopathy:     Cervical: No cervical adenopathy.  Skin:    General: Skin is warm.  Neurological:     Mental Status: She is alert and oriented to person, place, and time.  Psychiatric:        Attention and Perception: Attention normal.        Mood and Affect: Mood normal.        Speech: Speech normal.        Behavior: Behavior normal. Behavior is cooperative.     Last depression screening scores    07/19/2022    3:44 PM 01/17/2022    9:09 AM 08/02/2021    8:34 AM  PHQ 2/9 Scores  PHQ - 2 Score 0 2 2  PHQ- 9 Score 5 10 11    Last fall risk screening    01/17/2022    9:09 AM  Fall Risk   Falls in the past year? 0  Number falls in past yr: 0  Injury with Fall? 0  Risk for fall due to : No Fall Risks   Last Audit-C alcohol use screening    01/17/2022     9:09 AM  Alcohol Use Disorder Test (AUDIT)  1. How often do you have a drink containing alcohol? 1  2. How many drinks containing alcohol do you have on a typical day when you are drinking? 0  3. How often do you have six or more drinks on one occasion? 0  AUDIT-C Score 1   A score of 3 or more in women, and 4 or more in men indicates increased risk for alcohol abuse, EXCEPT if all of the points are from question 1   Results for orders placed or performed in visit on 07/19/22  Lipid Profile  Result Value Ref Range   Cholesterol, Total 248 (H) 100 - 199 mg/dL   Triglycerides 09/18/22 (H) 0 - 149 mg/dL   HDL 65 295 mg/dL   VLDL Cholesterol Cal 30 5 - 40 mg/dL   LDL Chol Calc (NIH) >28 (H) 0 - 99 mg/dL   Chol/HDL Ratio 3.8 0.0 - 4.4 ratio  CBC w/Diff/Platelet  Result Value Ref Range   WBC 9.7 3.4 - 10.8 x10E3/uL   RBC 4.37 3.77 - 5.28 x10E6/uL   Hemoglobin 12.8 11.1 - 15.9 g/dL   Hematocrit 413 24.4 - 46.6 %   MCV 83 79 - 97 fL   MCH 29.3 26.6 - 33.0 pg   MCHC 35.2 31.5 - 35.7 g/dL   RDW 01.0 27.2 - 53.6 %   Platelets 416 150 - 450 x10E3/uL   Neutrophils 62 Not Estab. %   Lymphs 33 Not Estab. %   Monocytes 4 Not Estab. %   Eos 0 Not Estab. %   Basos 1 Not Estab. %   Neutrophils Absolute 6.0 1.4 - 7.0 x10E3/uL   Lymphocytes Absolute 3.2 (H) 0.7 - 3.1 x10E3/uL   Monocytes Absolute 0.4 0.1 - 0.9 x10E3/uL  EOS (ABSOLUTE) 0.0 0.0 - 0.4 x10E3/uL   Basophils Absolute 0.1 0.0 - 0.2 x10E3/uL   Immature Granulocytes 0 Not Estab. %   Immature Grans (Abs) 0.0 0.0 - 0.1 x10E3/uL  HgB A1c  Result Value Ref Range   Hgb A1c MFr Bld 5.5 4.8 - 5.6 %   Est. average glucose Bld gHb Est-mCnc 111 mg/dL    Assessment & Plan    Routine Health Maintenance and Physical Exam  Exercise Activities and Dietary recommendations --balanced diet high in fiber and protein, low in sugars, carbs, fats. --physical activity/exercise 30 minutes 3-5 times a week    Immunization History  Administered  Date(s) Administered   Influenza,inj,Quad PF,6+ Mos 08/31/2021, 07/06/2022   PFIZER(Purple Top)SARS-COV-2 Vaccination 12/14/2019, 01/04/2020   PPD Test 05/26/2016, 04/20/2021    Health Maintenance  Topic Date Due   Hepatitis C Screening  Never done   TETANUS/TDAP  Never done   COVID-19 Vaccine (3 - Pfizer series) 02/29/2020   MAMMOGRAM  10/05/2022   PAP SMEAR-Modifier  10/05/2024   INFLUENZA VACCINE  Completed   HIV Screening  Completed   HPV VACCINES  Aged Out    Discussed health benefits of physical activity, and encouraged her to engage in regular exercise appropriate for her age and condition.  Acute sinusitis Rx amoxicillin bid x 10 days, diarrhea may be 2/2 to ongoing infection. Unlikely diarrhea is 2/2 to fall. Advised to take abx with food, increase fluids, add probiotic. Advised if not resolved to call office  Problem List Items Addressed This Visit       Cardiovascular and Mediastinum   HTN (hypertension)    Controlled in office continue medications, lisinopril 10 mg  Ordered cmp  F/u 6 mo       Relevant Orders   CBC w/Diff/Platelet (Completed)     Other   HLD (hyperlipidemia)    Repeat lipid panel, if stable can check annually.  The 10-year ASCVD risk score (Arnett DK, et al., 2019) is: 0.9%       Relevant Orders   Lipid Profile (Completed)   CBC w/Diff/Platelet (Completed)   Other Visit Diagnoses     Annual physical exam    -  Primary   Hyperglycemia       Relevant Orders   CBC w/Diff/Platelet (Completed)   HgB A1c (Completed)   Acute non-recurrent frontal sinusitis       Relevant Medications   amoxicillin (AMOXIL) 875 MG tablet       Return in about 6 months (around 01/18/2023) for hypertension.     I, [name], PA-C have reviewed all documentation for this visit. The documentation on  07/19/2022 for the exam, diagnosis, procedures, and orders are all accurate and complete.  [name], PA-C [facility] [address] [place], [place], [address] Office: [phone] Fax: [email or web address]

## 2022-07-20 ENCOUNTER — Encounter: Payer: Self-pay | Admitting: Physician Assistant

## 2022-07-20 LAB — CBC WITH DIFFERENTIAL/PLATELET
Basophils Absolute: 0.1 10*3/uL (ref 0.0–0.2)
Basos: 1 %
EOS (ABSOLUTE): 0 10*3/uL (ref 0.0–0.4)
Eos: 0 %
Hematocrit: 36.4 % (ref 34.0–46.6)
Hemoglobin: 12.8 g/dL (ref 11.1–15.9)
Immature Grans (Abs): 0 10*3/uL (ref 0.0–0.1)
Immature Granulocytes: 0 %
Lymphocytes Absolute: 3.2 10*3/uL — ABNORMAL HIGH (ref 0.7–3.1)
Lymphs: 33 %
MCH: 29.3 pg (ref 26.6–33.0)
MCHC: 35.2 g/dL (ref 31.5–35.7)
MCV: 83 fL (ref 79–97)
Monocytes Absolute: 0.4 10*3/uL (ref 0.1–0.9)
Monocytes: 4 %
Neutrophils Absolute: 6 10*3/uL (ref 1.4–7.0)
Neutrophils: 62 %
Platelets: 416 10*3/uL (ref 150–450)
RBC: 4.37 x10E6/uL (ref 3.77–5.28)
RDW: 12.4 % (ref 11.7–15.4)
WBC: 9.7 10*3/uL (ref 3.4–10.8)

## 2022-07-20 LAB — LIPID PANEL
Chol/HDL Ratio: 3.8 ratio (ref 0.0–4.4)
Cholesterol, Total: 248 mg/dL — ABNORMAL HIGH (ref 100–199)
HDL: 65 mg/dL (ref >39)
LDL Chol Calc (NIH): 153 mg/dL — ABNORMAL HIGH (ref 0–99)
Triglycerides: 170 mg/dL — ABNORMAL HIGH (ref 0–149)
VLDL Cholesterol Cal: 30 mg/dL (ref 5–40)

## 2022-07-20 LAB — HEMOGLOBIN A1C
Est. average glucose Bld gHb Est-mCnc: 111 mg/dL
Hgb A1c MFr Bld: 5.5 % (ref 4.8–5.6)

## 2022-07-20 NOTE — Assessment & Plan Note (Signed)
Repeat lipid panel, if stable can check annually.  The 10-year ASCVD risk score (Arnett DK, et al., 2019) is: 0.9%

## 2022-09-17 ENCOUNTER — Other Ambulatory Visit: Payer: Self-pay | Admitting: Physician Assistant

## 2022-09-17 DIAGNOSIS — R0982 Postnasal drip: Secondary | ICD-10-CM

## 2022-09-17 DIAGNOSIS — R0981 Nasal congestion: Secondary | ICD-10-CM

## 2022-09-18 ENCOUNTER — Encounter: Payer: Self-pay | Admitting: Physician Assistant

## 2022-10-23 ENCOUNTER — Encounter: Payer: Self-pay | Admitting: Physician Assistant

## 2022-10-23 ENCOUNTER — Ambulatory Visit (INDEPENDENT_AMBULATORY_CARE_PROVIDER_SITE_OTHER): Payer: BC Managed Care – PPO | Admitting: Physician Assistant

## 2022-10-23 VITALS — BP 128/86 | HR 84 | Temp 98.2°F | Ht 67.0 in | Wt 223.0 lb

## 2022-10-23 DIAGNOSIS — Z1231 Encounter for screening mammogram for malignant neoplasm of breast: Secondary | ICD-10-CM

## 2022-10-23 DIAGNOSIS — R197 Diarrhea, unspecified: Secondary | ICD-10-CM | POA: Diagnosis not present

## 2022-10-23 DIAGNOSIS — Z3009 Encounter for other general counseling and advice on contraception: Secondary | ICD-10-CM

## 2022-10-23 MED ORDER — NIKKI 3-0.02 MG PO TABS: 1.0000 | ORAL_TABLET | Freq: Every day | ORAL | 3 refills | Status: DC

## 2022-10-23 NOTE — Progress Notes (Unsigned)
      Established patient visit   Patient: Christina [name]   DOB: 06-04-1981   42 y.o. Female  MRN: [id number] Visit Date: 10/23/2022  Today's healthcare provider: [name], PA-C   No chief complaint on file.  Subjective    HPI HPI   Annual  Last edited by Elta Guadeloupe, CMA on 10/23/2022  3:53 PM.      Diarrhea Increase lexapro to 20 mg Increase stressed Tenesmus, bloating and gas  Denies URI  Ocp 3 mo on 1 week   Medications: Outpatient Medications Prior to Visit  Medication Sig   Azelastine-Fluticasone 137-50 MCG/ACT SUSP PLACE 1 SPRAY INTO THE NOSE EVERY 12 (TWELVE) HOURS.   escitalopram (LEXAPRO) 10 MG tablet Take 20 mg by mouth daily.   esomeprazole (NEXIUM) 40 MG capsule Take 40 mg by mouth daily at 12 noon.   fexofenadine (ALLEGRA) 180 MG tablet TAKE 1 TABLET BY MOUTH EVERY DAY   guanFACINE (INTUNIV) 1 MG TB24 ER tablet SMARTSIG:1 Tablet(s) By Mouth Every Evening   HORIZANT 600 MG TBCR Take 1 tablet by mouth daily.   lisinopril (ZESTRIL) 10 MG tablet TAKE 1 TABLET BY MOUTH EVERY DAY   NIKKI 3-0.02 MG tablet Take 1 tablet by mouth daily.   tiZANidine (ZANAFLEX) 4 MG tablet Take 8 mg by mouth at bedtime.   VYVANSE 30 MG capsule Take 30 mg by mouth daily.   zolpidem (AMBIEN) 10 MG tablet Take 10 mg by mouth at bedtime as needed.   No facility-administered medications prior to visit.    Review of Systems    Objective    Blood pressure 128/86, pulse 84, temperature 98.2 F (36.8 C), height 5\' 7"  (1.702 m), weight 223 lb (101.2 kg), SpO2 98 %, unknown if currently breastfeeding.   Physical Exam  ***  No results found for any visits on 10/23/22.  Assessment & Plan     ***  No follow-ups on file.      I, [name], PA-C have reviewed all documentation for this visit. The documentation on  10/23/22  for the exam, diagnosis, procedures, and orders are all accurate and complete.  [name], PA-C [facility] [address] [place], [place], [address] Office: [phone] Fax: [place]

## 2022-10-24 ENCOUNTER — Encounter: Payer: Self-pay | Admitting: Physician Assistant

## 2022-10-24 DIAGNOSIS — R197 Diarrhea, unspecified: Secondary | ICD-10-CM | POA: Insufficient documentation

## 2022-10-24 NOTE — Assessment & Plan Note (Signed)
Refilled OCP. Okay to continue with current use pattern

## 2022-10-24 NOTE — Assessment & Plan Note (Signed)
May be 2/2 increase dose in lexapro  Tenderness at RUQ is unusual if it is a med SE. Advised if symptoms do not improve by the end of the week would order cmp/cbc/lipase and RUQ ultrasound

## 2022-11-06 ENCOUNTER — Ambulatory Visit (INDEPENDENT_AMBULATORY_CARE_PROVIDER_SITE_OTHER): Payer: BC Managed Care – PPO | Admitting: Physician Assistant

## 2022-11-06 ENCOUNTER — Other Ambulatory Visit (HOSPITAL_COMMUNITY)
Admission: RE | Admit: 2022-11-06 | Discharge: 2022-11-06 | Disposition: A | Discharge status: Home / Self Care | Payer: BC Managed Care – PPO | Source: Ambulatory Visit | Attending: Physician Assistant | Admitting: Physician Assistant

## 2022-11-06 ENCOUNTER — Encounter: Payer: Self-pay | Admitting: Physician Assistant

## 2022-11-06 VITALS — BP 122/90 | HR 99 | Temp 97.8°F | Resp 16 | Ht 64.0 in | Wt 219.2 lb

## 2022-11-06 DIAGNOSIS — R300 Dysuria: Secondary | ICD-10-CM | POA: Diagnosis not present

## 2022-11-06 DIAGNOSIS — N898 Other specified noninflammatory disorders of vagina: Secondary | ICD-10-CM | POA: Insufficient documentation

## 2022-11-06 LAB — POCT URINALYSIS DIPSTICK
Bilirubin, UA: NEGATIVE
Blood, UA: NEGATIVE
Glucose, UA: NEGATIVE
Ketones, UA: NEGATIVE
Nitrite, UA: NEGATIVE
Protein, UA: NEGATIVE
Spec Grav, UA: 1.01 (ref 1.010–1.025)
Urobilinogen, UA: 0.2 E.U./dL
pH, UA: 6 (ref 5.0–8.0)

## 2022-11-06 MED ORDER — CLOTRIMAZOLE-BETAMETHASONE 1-0.05 % EX CREA: 1.0000 | TOPICAL_CREAM | Freq: Every day | CUTANEOUS | 0 refills | Status: DC

## 2022-11-06 MED ORDER — FLUCONAZOLE 150 MG PO TABS: 150.0000 mg | ORAL_TABLET | Freq: Once | ORAL | 0 refills | Status: AC

## 2022-11-06 NOTE — Progress Notes (Signed)
I,Sulibeya S Dimas,acting as a Education administrator for [employer], PA-C.,have documented all relevant documentation on the behalf of Christina Kirschner, PA-C,as directed by  Christina Kirschner, PA-C while in the presence of Christina Kirschner, PA-C.     Established patient visit   Patient: Christina [name]   DOB: 1981/09/22   42 y.o. Female  MRN: [id number] Visit Date: 11/06/2022  Today's healthcare provider: [name], PA-C   Chief Complaint  Patient presents with   Urinary Tract Infection   Subjective    HPI  Urinary symptoms  She reports new onset dysuria, flank pain, and urinary frequency. The current episode started  4 days ago and is worsening. Patient states symptoms are moderate in intensity, occurring constantly. Reports overall vaginal irritation and burning to touch. Use monistat 3 day OTC with no improvement. She has not been recently treated for similar symptoms.  ---------------------------------------------------------------------------------------  Medications: Outpatient Medications Prior to Visit  Medication Sig   Azelastine-Fluticasone 137-50 MCG/ACT SUSP PLACE 1 SPRAY INTO THE NOSE EVERY 12 (TWELVE) HOURS.   escitalopram (LEXAPRO) 10 MG tablet Take 20 mg by mouth daily.   esomeprazole (NEXIUM) 40 MG capsule Take 40 mg by mouth daily at 12 noon.   fexofenadine (ALLEGRA) 180 MG tablet TAKE 1 TABLET BY MOUTH EVERY DAY   guanFACINE (INTUNIV) 1 MG TB24 ER tablet SMARTSIG:1 Tablet(s) By Mouth Every Evening   HORIZANT 600 MG TBCR Take 1 tablet by mouth daily.   lisinopril (ZESTRIL) 10 MG tablet TAKE 1 TABLET BY MOUTH EVERY DAY   NIKKI 3-0.02 MG tablet Take 1 tablet by mouth daily.   tiZANidine (ZANAFLEX) 4 MG tablet Take 8 mg by mouth at bedtime.   VYVANSE 30 MG capsule Take 30 mg by mouth daily.   zolpidem (AMBIEN) 10 MG tablet Take 10 mg by mouth at bedtime as needed.   No facility-administered medications prior to visit.    Review of Systems  Constitutional:   Negative for chills and fever.  Gastrointestinal:  Negative for abdominal pain, constipation, diarrhea, nausea and vomiting.  Genitourinary:  Positive for dysuria, flank pain, frequency, pelvic pain and vaginal discharge. Negative for hematuria.     Objective    BP (!) 122/90 (BP Location: Right Arm, Patient Position: Sitting, Cuff Size: Large)   Pulse 99   Temp 97.8 F (36.6 C) (Temporal)   Resp 16   Ht 5\' 4"  (1.626 m)   Wt 219 lb 3.2 oz (99.4 kg)   LMP 11/01/2022 (Exact Date)   BMI 37.63 kg/m  BP Readings from Last 3 Encounters:  11/06/22 (!) 122/90  10/23/22 128/86  07/19/22 129/84   Wt Readings from Last 3 Encounters:  11/06/22 219 lb 3.2 oz (99.4 kg)  10/23/22 223 lb (101.2 kg)  07/19/22 217 lb (98.4 kg)      Physical Exam Vitals reviewed.  Constitutional:      Appearance: She is not ill-appearing.  HENT:     Head: Normocephalic.  Eyes:     Conjunctiva/sclera: Conjunctivae normal.  Cardiovascular:     Rate and Rhythm: Normal rate.  Pulmonary:     Effort: Pulmonary effort is normal. No respiratory distress.  Genitourinary:    Labia:        Right: No rash or lesion.        Left: No rash or lesion.      Vagina: Vaginal discharge present.  Neurological:     General: No focal deficit present.     Mental Status: She is alert and  oriented to person, place, and time.  Psychiatric:        Mood and Affect: Mood normal.        Behavior: Behavior normal.      Results for orders placed or performed in visit on 11/06/22  POCT urinalysis dipstick  Result Value Ref Range   Color, UA yellow    Clarity, UA clear    Glucose, UA Negative Negative   Bilirubin, UA Negative    Ketones, UA Negative    Spec Grav, UA 1.010 1.010 - 1.025   Blood, UA Negative    pH, UA 6.0 5.0 - 8.0   Protein, UA Negative Negative   Urobilinogen, UA 0.2 0.2 or 1.0 E.U./dL   Nitrite, UA Negative    Leukocytes, UA Small (1+) (A) Negative    Assessment & Plan     Vaginal irritation 2.  Dysuria UA + leuk -- ordered culture Swabbed to r/o yeast, bv, g/c, trich  D/t severity of symptoms will treat today with diflucan and topical clotrimazole/betamethasone  Return if symptoms worsen or fail to improve.      I, Christina Kirschner, PA-C have reviewed all documentation for this visit. The documentation on  11/06/22 for the exam, diagnosis, procedures, and orders are all accurate and complete.  Christina Kirschner, PA-C [facility] [address] [place], [place], [address] Office: [phone] Fax: [place]

## 2022-11-07 ENCOUNTER — Encounter: Payer: Self-pay | Admitting: Physician Assistant

## 2022-11-08 ENCOUNTER — Other Ambulatory Visit: Payer: Self-pay | Admitting: Physician Assistant

## 2022-11-08 DIAGNOSIS — N898 Other specified noninflammatory disorders of vagina: Secondary | ICD-10-CM

## 2022-11-08 DIAGNOSIS — R102 Pelvic and perineal pain: Secondary | ICD-10-CM

## 2022-11-08 LAB — CERVICOVAGINAL ANCILLARY ONLY
Bacterial Vaginitis (gardnerella): NEGATIVE
Candida Glabrata: NEGATIVE
Candida Vaginitis: NEGATIVE
Chlamydia: NEGATIVE
Comment: NEGATIVE
Comment: NEGATIVE
Comment: NEGATIVE
Comment: NEGATIVE
Comment: NEGATIVE
Comment: NORMAL
Neisseria Gonorrhea: NEGATIVE
Trichomonas: NEGATIVE

## 2022-11-08 LAB — URINE CULTURE

## 2022-11-08 MED ORDER — TRIAMCINOLONE ACETONIDE 0.025 % EX OINT: 1.0000 | TOPICAL_OINTMENT | Freq: Every day | CUTANEOUS | 0 refills | Status: DC

## 2022-11-16 ENCOUNTER — Ambulatory Visit: Payer: BC Managed Care – PPO | Admitting: Physician Assistant

## 2022-11-16 ENCOUNTER — Encounter: Payer: Self-pay | Admitting: Physician Assistant

## 2022-11-16 VITALS — BP 144/96 | HR 96 | Wt 215.3 lb

## 2022-11-16 DIAGNOSIS — L0291 Cutaneous abscess, unspecified: Secondary | ICD-10-CM

## 2022-11-16 MED ORDER — AMOXICILLIN 500 MG PO CAPS: 500.0000 mg | ORAL_CAPSULE | Freq: Two times a day (BID) | ORAL | 0 refills | Status: AC

## 2022-11-16 NOTE — Progress Notes (Signed)
I,[name],acting as a Education administrator for [employer], PA-C.,have documented all relevant documentation on the behalf of [name], PA-C,as directed by  [name], PA-C while in the presence of [name], PA-C.   Established patient visit   Patient: Christina [name]   DOB: [birth year]   42 y.o. Female  MRN: [id number] Visit Date: 11/16/2022  Today's healthcare provider: [name], PA-C   Cc. Lump right inner thigh x 4-5 days  Subjective    HPI   Patient reports a painful lump on her right inner thigh for the past 4 to 5 days.  Over the last 2 days it is grown considerably and become more painful.  She has used warm compresses with no relief.  Right upper inner thigh, 3-4 cm fluctuant Used 0.5-1 cc lidocaine Small amount Rx amox  Medications: Outpatient Medications Prior to Visit  Medication Sig   Azelastine-Fluticasone 137-50 MCG/ACT SUSP PLACE 1 SPRAY INTO THE NOSE EVERY 12 (TWELVE) HOURS.   clotrimazole-betamethasone (LOTRISONE) cream Apply 1 Application topically daily. Use sparingly   escitalopram (LEXAPRO) 10 MG tablet Take 20 mg by mouth daily.   esomeprazole (NEXIUM) 40 MG capsule Take 40 mg by mouth daily at 12 noon.   fexofenadine (ALLEGRA) 180 MG tablet TAKE 1 TABLET BY MOUTH EVERY DAY   guanFACINE (INTUNIV) 1 MG TB24 ER tablet SMARTSIG:1 Tablet(s) By Mouth Every Evening   HORIZANT 600 MG TBCR Take 1 tablet by mouth daily.   lisinopril (ZESTRIL) 10 MG tablet TAKE 1 TABLET BY MOUTH EVERY DAY   NIKKI 3-0.02 MG tablet Take 1 tablet by mouth daily.   tiZANidine (ZANAFLEX) 4 MG tablet Take 8 mg by mouth at bedtime.   triamcinolone (KENALOG) 0.025 % ointment Apply 1 Application topically daily. On area of concern, use sparingly   VYVANSE 30 MG capsule Take 30 mg by mouth daily.   zolpidem (AMBIEN) 10 MG tablet Take 10 mg by mouth at bedtime as needed.   No facility-administered medications prior to visit.    Review of Systems   Constitutional:  Negative for fatigue and fever.  Respiratory:  Negative for cough and shortness of breath.   Cardiovascular:  Negative for chest pain and leg swelling.  Gastrointestinal:  Negative for abdominal pain.  Skin:  Positive for color change and rash.  Neurological:  Negative for dizziness and headaches.      Objective    LMP 11/01/2022 (Exact Date)  Blood pressure (!) 144/96, pulse 96, weight 215 lb 4.8 oz (97.7 kg), last menstrual period 11/01/2022, SpO2 100 %, unknown if currently breastfeeding.   Physical Exam Vitals reviewed.  Constitutional:      Appearance: She is not ill-appearing.  HENT:     Head: Normocephalic.  Eyes:     Conjunctiva/sclera: Conjunctivae normal.  Cardiovascular:     Rate and Rhythm: Normal rate.  Pulmonary:     Effort: Pulmonary effort is normal. No respiratory distress.  Skin:    Comments: Right medial upper thigh with a 2-3 cm fluctuant erythematous mass  Neurological:     General: No focal deficit present.     Mental Status: She is alert and oriented to person, place, and time.  Psychiatric:        Mood and Affect: Mood normal.        Behavior: Behavior normal.      No results found for any visits on 11/16/22.  Assessment & Plan     Abscess, right thigh  I&D performed today -  Area was cleaned with some Betadine, Xylocaine numbing was used, 1 cc.  A scalpel was used to puncture the area and a small amount of purulence was obtained.  Abscess was flushed with normal saline hemostasis was obtained and a dressing was placed.  Prescribed amoxicillin 500 mg twice a day for 5 days.  Encourage patient to continue warm compresses at home to continue dressing changes.  If any worsening symptoms or questions please return to the office   I, [name], PA-C have reviewed all documentation for this visit. The documentation on  11/17/22  for the exam, diagnosis, procedures, and orders are all accurate and complete.  [name],  PA-C [facility] [address] [place], [place], [address] Office: [phone] Fax: [place]

## 2022-11-20 ENCOUNTER — Encounter: Payer: Self-pay | Admitting: Advanced Practice Midwife

## 2022-11-20 ENCOUNTER — Other Ambulatory Visit: Payer: Self-pay | Admitting: Physician Assistant

## 2022-11-20 ENCOUNTER — Encounter: Payer: Self-pay | Admitting: Physician Assistant

## 2022-11-20 ENCOUNTER — Ambulatory Visit: Payer: BC Managed Care – PPO | Admitting: Advanced Practice Midwife

## 2022-11-20 VITALS — BP 140/90 | HR 101 | Ht 67.0 in | Wt 216.0 lb

## 2022-11-20 DIAGNOSIS — N9089 Other specified noninflammatory disorders of vulva and perineum: Secondary | ICD-10-CM | POA: Diagnosis not present

## 2022-11-20 DIAGNOSIS — L0291 Cutaneous abscess, unspecified: Secondary | ICD-10-CM

## 2022-11-20 NOTE — Telephone Encounter (Signed)
Please advise 

## 2022-11-22 NOTE — Progress Notes (Unsigned)
Patient ID: Christina [name], female   DOB: 19-May-1981, 42 y.o.   MRN: [id number]  Chief Complaint: Residual, after I&D right proximal thigh abscess  History of Present Illness Christina [name] is a 42 y.o. female with residual thickening at site of recent I&D.  She completed her course of antibiotics 2 days ago, stopped having drainage prior to this.  After I&D she did not do any packing, just utilized to cover dressing and warm compresses.  Denies any fevers or chills, denies any worsening of pain since antibiotics completed their course 2 days ago.  She reports there was no prior cystic lesion or lump in this area.  Past Medical History Past Medical History:  Diagnosis Date   Allergy    Anemia    Anxiety    GERD (gastroesophageal reflux disease)    Hyperlipidemia    Hypertension    Medical history non-contributory    Postpartum care following vaginal delivery (5/5) 02/11/2016   Pregnancy induced hypertension    Preterm labor    Second degree perineal laceration 08/09/2017   Vaginal Pap smear, abnormal       Past Surgical History:  Procedure Laterality Date   MANDIBLE SURGERY      Allergies  Allergen Reactions   Erythromycin Nausea And Vomiting    Current Outpatient Medications  Medication Sig Dispense Refill   Azelastine-Fluticasone 137-50 MCG/ACT SUSP PLACE 1 SPRAY INTO THE NOSE EVERY 12 (TWELVE) HOURS. 23 g 3   clotrimazole-betamethasone (LOTRISONE) cream Apply 1 Application topically daily. Use sparingly 30 g 0   escitalopram (LEXAPRO) 10 MG tablet Take 20 mg by mouth daily.     esomeprazole (NEXIUM) 40 MG capsule Take 40 mg by mouth daily at 12 noon.     fexofenadine (ALLEGRA) 180 MG tablet TAKE 1 TABLET BY MOUTH EVERY DAY 90 tablet 1   guanFACINE (INTUNIV) 1 MG TB24 ER tablet SMARTSIG:1 Tablet(s) By Mouth Every Evening     HORIZANT 600 MG TBCR Take 1 tablet by mouth daily.     lisinopril (ZESTRIL) 10 MG tablet TAKE 1 TABLET BY MOUTH EVERY DAY 90 tablet 1    NIKKI 3-0.02 MG tablet Take 1 tablet by mouth daily. 84 tablet 3   tiZANidine (ZANAFLEX) 4 MG tablet Take 8 mg by mouth at bedtime.     triamcinolone (KENALOG) 0.025 % ointment Apply 1 Application topically daily. On area of concern, use sparingly 15 g 0   VYVANSE 30 MG capsule Take 30 mg by mouth daily.     zolpidem (AMBIEN) 10 MG tablet Take 10 mg by mouth at bedtime as needed.     No current facility-administered medications for this visit.    Family History Family History  Problem Relation Age of Onset   Hyperlipidemia Mother    Hypertension Mother    Cataracts Mother    Hypertension Father    Hyperlipidemia Father    Anemia Father    Hypertension Brother    Hyperlipidemia Brother    Hypertension Maternal Grandmother    Hyperlipidemia Maternal Grandmother    Dementia Maternal Grandmother    Colon cancer Maternal Grandfather    Hypertension Paternal Grandmother    Hyperlipidemia Paternal Grandmother    Rheum arthritis Neg Hx    Osteoarthritis Neg Hx       Social History Social History   Tobacco Use   Smoking status: Never   Smokeless tobacco: Never  Vaping Use   Vaping Use: Never used  Substance Use Topics   Alcohol  use: No   Drug use: No        Review of Systems  Constitutional:  Negative for chills and fever.  HENT: Negative.    Eyes: Negative.   Respiratory: Negative.    Cardiovascular: Negative.   Gastrointestinal:  Positive for heartburn.  Genitourinary: Negative.   Skin:  Negative for rash.  Neurological: Negative.   Psychiatric/Behavioral: Negative.       Physical Exam Blood pressure (!) 141/93, pulse 90, temperature 98.5 F (36.9 C), temperature source Oral, height 5' 7"$  (1.702 m), weight 210 lb 12.8 oz (95.6 kg), last menstrual period 11/01/2022, SpO2 95 %. Last Weight  Most recent update: 11/23/2022  3:37 PM    Weight  95.6 kg (210 lb 12.8 oz)             CONSTITUTIONAL: Well developed, and nourished, appropriately responsive and  aware without distress.   EYES: Sclera non-icteric.   EARS, NOSE, MOUTH AND THROAT:  The oropharynx is clear. Oral mucosa is pink and moist.    Hearing is intact to voice.  NECK: Trachea is midline, and there is no jugular venous distension.  LYMPH NODES:  Lymph nodes in the neck are not appreciated. RESPIRATORY:   Normal respiratory effort without pathologic use of accessory muscles. CARDIOVASCULAR:  Well perfused.  GI: The abdomen is  soft, nontender, and nondistended.   MUSCULOSKELETAL:  Symmetrical muscle tone appreciated in all four extremities.    SKIN: Skin turgor is normal. No pathologic skin lesions appreciated.  Levada Dy present as chaperone.  There is a well-defined scar with expected subcutaneous thickening consistent with early scar formation, no evidence of active inflammation, erythematous induration or fluctuance or residual drainage present.  Greatest dimension is approximately 1 to 1.5 cm proximal medial right thigh. NEUROLOGIC:  Motor and sensation appear grossly normal.  Cranial nerves are grossly without defect. PSYCH:  Alert and oriented to person, place and time. Affect is appropriate for situation.  Data Reviewed I have personally reviewed what is currently available of the patient's imaging, recent labs and medical records.   Labs:     Latest Ref Rng & Units 07/19/2022    4:22 PM 03/03/2020    3:32 PM 10/17/2018    3:53 PM  CBC  WBC 3.4 - 10.8 x10E3/uL 9.7  9.0  7.3   Hemoglobin 11.1 - 15.9 g/dL 12.8  13.6  12.7   Hematocrit 34.0 - 46.6 % 36.4  39.7  37.7   Platelets 150 - 450 x10E3/uL 416  347  362       Latest Ref Rng & Units 01/17/2022    9:33 AM 08/02/2021    9:16 AM 03/03/2020    3:32 PM  CMP  Glucose 70 - 99 mg/dL 107  110  85   BUN 6 - 24 mg/dL 11  9  16   $ Creatinine 0.57 - 1.00 mg/dL 0.74  0.78  0.87   Sodium 134 - 144 mmol/L 140  139  138   Potassium 3.5 - 5.2 mmol/L 4.4  4.2  4.4   Chloride 96 - 106 mmol/L 103  102  103   CO2 20 - 29 mmol/L 22  20   19   $ Calcium 8.7 - 10.2 mg/dL 9.4  9.9  9.8   Total Protein 6.0 - 8.5 g/dL 7.3  7.7  7.8   Total Bilirubin 0.0 - 1.2 mg/dL 0.2  0.4  0.3   Alkaline Phos 44 - 121 IU/L 68  71  51  AST 0 - 40 IU/L 13  18  14   $ ALT 0 - 32 IU/L 12  25  14       $ Imaging: Radiological images reviewed:   Within last 24 hrs: No results found.  Assessment    Status post I&D of right proximal thigh abscess. Patient Active Problem List   Diagnosis Date Noted   Diarrhea 10/24/2022   Bilateral hand numbness 01/17/2022   Tailbone injury, initial encounter 10/14/2021   Sinus congestion 08/02/2021   Insulin resistance 08/02/2021   HLD (hyperlipidemia) 10/17/2018   HTN (hypertension) 10/17/2018   SVD (spontaneous vaginal delivery) 08/09/2017   Encounter for counseling regarding contraception 08/08/2017    Plan    The residual lump present here may be simply scar from her recent abscess cavity.  It is difficult to assume that there is a significant lesion remaining for excision or I&D.  I believe she is best served by observing for the next 2 weeks to see if they have this will continue to improve or regress as recurrent abscess or cystic lesion. We could proceed with excision now, but this may carry just as much morbidity as observation would at this point.  Anticipate seeing her back in 2 weeks or as needed should inflammation or recurrent abscess arise in the interim.  I do not believe there is a role of continued antibiotic treatment at this time.  Face-to-face time spent with the patient and accompanying care providers(if present) was 30 minutes, with more than 50% of the time spent counseling, educating, and coordinating care of the patient.    These notes generated with voice recognition software. I apologize for typographical errors.  Ronny Bacon M.D., FACS 11/23/2022, 3:54 PM

## 2022-11-23 ENCOUNTER — Ambulatory Visit: Payer: BC Managed Care – PPO | Admitting: Surgery

## 2022-11-23 ENCOUNTER — Encounter: Payer: Self-pay | Admitting: Surgery

## 2022-11-23 VITALS — BP 141/93 | HR 90 | Temp 98.5°F | Ht 67.0 in | Wt 210.8 lb

## 2022-11-23 DIAGNOSIS — L02415 Cutaneous abscess of right lower limb: Secondary | ICD-10-CM | POA: Diagnosis not present

## 2022-11-23 DIAGNOSIS — L02419 Cutaneous abscess of limb, unspecified: Secondary | ICD-10-CM | POA: Insufficient documentation

## 2022-11-23 DIAGNOSIS — L03115 Cellulitis of right lower limb: Secondary | ICD-10-CM

## 2022-11-23 NOTE — Patient Instructions (Signed)
If you have any concerns or questions, please feel free to call our office. Follow up as needed.   Epidermoid Cyst  An epidermoid cyst, also called an epidermal cyst, is a small lump under your skin. The cyst contains a substance called keratin. Do not try to pop or open the cyst yourself. What are the causes? A blocked hair follicle. A hair that curls and re-enters the skin instead of growing straight out of the skin. A blocked pore. Irritated skin. An injury to the skin. Certain conditions that are passed along from parent to child. Human papillomavirus (HPV). This happens rarely when cysts occur on the bottom of the feet. Long-term sun damage to the skin. What increases the risk? Having acne. Being female. Having an injury to the skin. Being past puberty. Having certain conditions caused by genes (genetic disorder) What are the signs or symptoms? These cysts are usually harmless, but they can get infected. Symptoms of infection may include: Redness. Inflammation. Tenderness. Warmth. Fever. A bad-smelling substance that drains from the cyst. Pus that drains from the cyst. How is this treated? In many cases, epidermoid cysts go away on their own without treatment. If a cyst becomes infected, treatment may include: Opening and draining the cyst, done by a doctor. After draining, you may need minor surgery to remove the rest of the cyst. Antibiotic medicine. Shots of medicines (steroids) that help to reduce inflammation. Surgery to remove the cyst. Surgery may be done if the cyst: Becomes large. Bothers you. Has a chance of turning into cancer. Do not try to open a cyst yourself. Follow these instructions at home: Medicines Take over-the-counter and prescription medicines as told by your doctor. If you were prescribed an antibiotic medicine, take it as told by your doctor. Do not stop taking it even if you start to feel better. General instructions Keep the area around your  cyst clean and dry. Wear loose, dry clothing. Avoid touching your cyst. Check your cyst every day for signs of infection. Check for: Redness, swelling, or pain. Fluid or blood. Warmth. Pus or a bad smell. Keep all follow-up visits. How is this prevented? Wear clean, dry, clothing. Avoid wearing tight clothing. Keep your skin clean and dry. Take showers or baths every day. Contact a doctor if: Your cyst has symptoms of infection. Your condition does not improve or gets worse. You have a cyst that looks different from other cysts you have had. You have a fever. Get help right away if: Redness spreads from the cyst into the area close by. Summary An epidermoid cyst is a small lump under your skin. If a cyst becomes infected, treatment may include surgery to open and drain the cyst, or to remove it. Take over-the-counter and prescription medicines only as told by your doctor. Contact a doctor if your condition is not improving or is getting worse. Keep all follow-up visits. This information is not intended to replace advice given to you by your health care provider. Make sure you discuss any questions you have with your health care provider. Document Revised: 12/31/2019 Document Reviewed: 12/31/2019 Elsevier Patient Education  2023 [employer]  

## 2022-11-29 ENCOUNTER — Encounter: Payer: Self-pay | Admitting: Advanced Practice Midwife

## 2022-11-29 NOTE — Progress Notes (Signed)
Patient ID: Christina [name], female   DOB: Feb 04, 1981, 42 y.o.   MRN: [id number]  Reason for Consult: Establish Care   Referred by [name], PA-C  Subjective:  Date of Service: 11/20/2022  HPI:  Christina [name] is a 42 y.o. female being seen for recent episode of vaginal irritation. She reports having her period about 2 weeks ago- cycles occur every 3 months- and she wore pads for 8 hours straight while at work for 2 days in a row. After that is when the irritation started. Symptoms were external and described as red, inflamed, hurt to pee, hurt to touch and of moderate to severe intensity. She saw PCP who prescribed Lotrisone and diflucan. Labs were normal and symptoms gradually resolved. She is seeking gyn care to be sure there is no other problem. She also mentions an abscess on her right thigh and she has follow up appointment scheduled for possible drainage. Last PAP smear was normal 2 years ago.   Past Medical History:  Diagnosis Date   Allergy    Anemia    Anxiety    GERD (gastroesophageal reflux disease)    Hyperlipidemia    Hypertension    Medical history non-contributory    Postpartum care following vaginal delivery (5/5) 02/11/2016   Pregnancy induced hypertension    Preterm labor    Second degree perineal laceration 08/09/2017   Vaginal Pap smear, abnormal    Family History  Problem Relation Age of Onset   Hyperlipidemia Mother    Hypertension Mother    Cataracts Mother    Hypertension Father    Hyperlipidemia Father    Anemia Father    Hypertension Brother    Hyperlipidemia Brother    Hypertension Maternal Grandmother    Hyperlipidemia Maternal Grandmother    Dementia Maternal Grandmother    Colon cancer Maternal Grandfather    Hypertension Paternal Grandmother    Hyperlipidemia Paternal Grandmother    Rheum arthritis Neg Hx    Osteoarthritis Neg Hx    Past Surgical History:  Procedure Laterality Date   MANDIBLE SURGERY      Short  Social History:  Social History   Tobacco Use   Smoking status: Never   Smokeless tobacco: Never  Substance Use Topics   Alcohol use: No    Allergies  Allergen Reactions   Erythromycin Nausea And Vomiting    Current Outpatient Medications  Medication Sig Dispense Refill   Azelastine-Fluticasone 137-50 MCG/ACT SUSP PLACE 1 SPRAY INTO THE NOSE EVERY 12 (TWELVE) HOURS. 23 g 3   clotrimazole-betamethasone (LOTRISONE) cream Apply 1 Application topically daily. Use sparingly 30 g 0   escitalopram (LEXAPRO) 10 MG tablet Take 20 mg by mouth daily.     esomeprazole (NEXIUM) 40 MG capsule Take 40 mg by mouth daily at 12 noon.     fexofenadine (ALLEGRA) 180 MG tablet TAKE 1 TABLET BY MOUTH EVERY DAY 90 tablet 1   guanFACINE (INTUNIV) 1 MG TB24 ER tablet SMARTSIG:1 Tablet(s) By Mouth Every Evening     HORIZANT 600 MG TBCR Take 1 tablet by mouth daily.     lisinopril (ZESTRIL) 10 MG tablet TAKE 1 TABLET BY MOUTH EVERY DAY 90 tablet 1   NIKKI 3-0.02 MG tablet Take 1 tablet by mouth daily. 84 tablet 3   tiZANidine (ZANAFLEX) 4 MG tablet Take 8 mg by mouth at bedtime.     triamcinolone (KENALOG) 0.025 % ointment Apply 1 Application topically daily. On area of concern, use sparingly 15 g 0  VYVANSE 30 MG capsule Take 30 mg by mouth daily.     zolpidem (AMBIEN) 10 MG tablet Take 10 mg by mouth at bedtime as needed.     No current facility-administered medications for this visit.   Review of Systems  Constitutional:  Negative for chills and fever.  HENT:  Negative for congestion, ear discharge, ear pain, hearing loss, sinus pain and sore throat.   Eyes:  Negative for blurred vision and double vision.  Respiratory:  Negative for cough, shortness of breath and wheezing.   Cardiovascular:  Negative for chest pain, palpitations and leg swelling.  Gastrointestinal:  Negative for abdominal pain, blood in stool, constipation, diarrhea, heartburn, melena, nausea and vomiting.  Genitourinary:  Negative  for dysuria, flank pain, frequency, hematuria and urgency.  Musculoskeletal:  Negative for back pain, joint pain and myalgias.  Skin:  Negative for itching and rash.       Positive for abscess  Neurological:  Negative for dizziness, tingling, tremors, sensory change, speech change, focal weakness, seizures, loss of consciousness, weakness and headaches.  Endo/Heme/Allergies:  Negative for environmental allergies. Does not bruise/bleed easily.  Psychiatric/Behavioral:  Negative for depression, hallucinations, memory loss, substance abuse and suicidal ideas. The patient is not nervous/anxious and does not have insomnia.        Objective:  Objective   Vitals:   11/20/22 1521 11/20/22 1553  BP: (!) 131/100 (!) 140/90  Pulse: (!) 101   Weight: 216 lb (98 kg)   Height: 5' 7"$  (1.702 m)    Body mass index is 33.83 kg/m. Constitutional: Well nourished, well developed female in no acute distress.  HEENT: normal Skin: Warm and dry.   Extremity:  no edema   Respiratory:  Normal respiratory effort Psych: Alert and Oriented x3. No memory deficits. Normal mood and affect.    Pelvic exam: (female chaperone present) is not limited by body habitus EGBUS: within normal limits, no lesions, no inflammation Vagina: within normal limits and with normal mucosa    Assessment/Plan:     42 y.o. female with normal gyn exam, care established  Follow up for gyn/well woman care as needed   [facility] 11/29/2022, 3:21 PM

## 2022-12-11 ENCOUNTER — Ambulatory Visit
Admission: RE | Admit: 2022-12-11 | Discharge: 2022-12-11 | Disposition: A | Discharge status: Home / Self Care | Payer: BC Managed Care – PPO | Source: Ambulatory Visit | Attending: Emergency Medicine | Admitting: Emergency Medicine

## 2022-12-11 VITALS — BP 135/96 | HR 110 | Temp 98.7°F | Resp 18

## 2022-12-11 DIAGNOSIS — B349 Viral infection, unspecified: Secondary | ICD-10-CM

## 2022-12-11 DIAGNOSIS — Z1152 Encounter for screening for COVID-19: Secondary | ICD-10-CM | POA: Insufficient documentation

## 2022-12-11 DIAGNOSIS — I10 Essential (primary) hypertension: Secondary | ICD-10-CM | POA: Insufficient documentation

## 2022-12-11 DIAGNOSIS — J029 Acute pharyngitis, unspecified: Secondary | ICD-10-CM | POA: Insufficient documentation

## 2022-12-11 LAB — POCT RAPID STREP A (OFFICE): Rapid Strep A Screen: NEGATIVE

## 2022-12-11 LAB — POCT INFLUENZA A/B
Influenza A, POC: NEGATIVE
Influenza B, POC: NEGATIVE

## 2022-12-11 NOTE — ED Provider Notes (Signed)
Christina [name]    CSN: [id number] Arrival date & time: 12/11/22  [id number]      History   Chief Complaint Chief Complaint  Patient presents with   Sore Throat    Entered by patient    HPI Christina [name] is a 42 y.o. female.  Patient presents with sore throat and fever x 1.5 days.  She denies rash, ear pain, cough, shortness of breath, vomiting, diarrhea, or other symptoms.  Treating with naproxen.  Her medical history includes hypertension.    The history is provided by the patient and medical records.    Past Medical History:  Diagnosis Date   Allergy    Anemia    Anxiety    GERD (gastroesophageal reflux disease)    Hyperlipidemia    Hypertension    Medical history non-contributory    Postpartum care following vaginal delivery (5/5) 02/11/2016   Pregnancy induced hypertension    Preterm labor    Second degree perineal laceration 08/09/2017   Vaginal Pap smear, abnormal     Patient Active Problem List   Diagnosis Date Noted   Cellulitis and abscess of leg 11/23/2022   Diarrhea 10/24/2022   Bilateral hand numbness 01/17/2022   Tailbone injury, initial encounter 10/14/2021   Sinus congestion 08/02/2021   Insulin resistance 08/02/2021   HLD (hyperlipidemia) 10/17/2018   HTN (hypertension) 10/17/2018   SVD (spontaneous vaginal delivery) 08/09/2017   Encounter for counseling regarding contraception 08/08/2017    Past Surgical History:  Procedure Laterality Date   MANDIBLE SURGERY      OB History     Gravida  3   Para  2   Term  2   Preterm  0   AB  1   Living  2      SAB  1   IAB  0   Ectopic  0   Multiple  0   Live Births  2            Home Medications    Prior to Admission medications   Medication Sig Start Date End Date Taking? Authorizing Provider  Azelastine-Fluticasone 137-50 MCG/ACT SUSP PLACE 1 SPRAY INTO THE NOSE EVERY 12 (TWELVE) HOURS. 09/18/22   [name], PA-C  clotrimazole-betamethasone (LOTRISONE)  cream Apply 1 Application topically daily. Use sparingly 11/06/22   Drubel, Ria Comment, PA-C  escitalopram (LEXAPRO) 10 MG tablet Take 20 mg by mouth daily. 07/31/21   [provider]  esomeprazole (NEXIUM) 40 MG capsule Take 40 mg by mouth daily at 12 noon.    [provider]  fexofenadine (ALLEGRA) 180 MG tablet TAKE 1 TABLET BY MOUTH EVERY DAY 12/13/21   Drubel, Ria Comment, PA-C  guanFACINE (INTUNIV) 1 MG TB24 ER tablet SMARTSIG:1 Tablet(s) By Mouth Every Evening 11/30/21   [provider]  HORIZANT 600 MG TBCR Take 1 tablet by mouth daily. 07/12/22   [provider]  lisinopril (ZESTRIL) 10 MG tablet TAKE 1 TABLET BY MOUTH EVERY DAY 07/11/22   Drubel, Ria Comment, PA-C  NIKKI 3-0.02 MG tablet Take 1 tablet by mouth daily. 10/23/22   [name], PA-C  tiZANidine (ZANAFLEX) 4 MG tablet Take 8 mg by mouth at bedtime. 06/24/21   [provider]  triamcinolone (KENALOG) 0.025 % ointment Apply 1 Application topically daily. On area of concern, use sparingly 11/08/22   Drubel, Ria Comment, PA-C  VYVANSE 30 MG capsule Take 30 mg by mouth daily. 02/09/21   [provider]  zolpidem (AMBIEN) 10 MG tablet Take 10  mg by mouth at bedtime as needed. 12/07/21   [provider]    Family History Family History  Problem Relation Age of Onset   Hyperlipidemia Mother    Hypertension Mother    Cataracts Mother    Hypertension Father    Hyperlipidemia Father    Anemia Father    Hypertension Brother    Hyperlipidemia Brother    Hypertension Maternal Grandmother    Hyperlipidemia Maternal Grandmother    Dementia Maternal Grandmother    Colon cancer Maternal Grandfather    Hypertension Paternal Grandmother    Hyperlipidemia Paternal Grandmother    Rheum arthritis Neg Hx    Osteoarthritis Neg Hx     Social History Social History   Tobacco Use   Smoking status: Never   Smokeless tobacco: Never  Vaping Use   Vaping Use: Never used  Substance Use Topics    Alcohol use: No   Drug use: No     Allergies   Erythromycin   Review of Systems Review of Systems  Constitutional:  Positive for fever. Negative for chills.  HENT:  Positive for sore throat. Negative for ear pain.   Respiratory:  Negative for cough and shortness of breath.   Cardiovascular:  Negative for chest pain and palpitations.  Gastrointestinal:  Negative for diarrhea and vomiting.  Skin:  Negative for color change and rash.  All other systems reviewed and are negative.    Physical Exam Triage Vital Signs ED Triage Vitals  Enc Vitals Group     BP      Pulse      Resp      Temp      Temp src      SpO2      Weight      Height      Head Circumference      Peak Flow      Pain Score      Pain Loc      Pain Edu?      Excl. in [place]?    No data found.  Updated Vital Signs BP (!) 135/96   Pulse (!) 110   Temp 98.7 F (37.1 C)   Resp 18   LMP 12/03/2022 (Approximate)   SpO2 98%   Visual Acuity Right Eye Distance:   Left Eye Distance:   Bilateral Distance:    Right Eye Near:   Left Eye Near:    Bilateral Near:     Physical Exam Vitals and nursing note reviewed.  Constitutional:      General: She is not in acute distress.    Appearance: She is well-developed. She is ill-appearing.  HENT:     Head: Normocephalic and atraumatic.     Right Ear: Tympanic membrane normal.     Left Ear: Tympanic membrane normal.     Nose: Nose normal.     Mouth/Throat:     Mouth: Mucous membranes are moist.     Pharynx: Posterior oropharyngeal erythema present.  Cardiovascular:     Rate and Rhythm: Normal rate and regular rhythm.     Heart sounds: Normal heart sounds.  Pulmonary:     Effort: Pulmonary effort is normal. No respiratory distress.     Breath sounds: Normal breath sounds.  Musculoskeletal:     Cervical back: Neck supple.  Skin:    General: Skin is warm and dry.  Neurological:     Mental Status: She is alert.  Psychiatric:        Mood  and Affect: Mood  normal.        Behavior: Behavior normal.      UC Treatments / Results  Labs (all labs ordered are listed, but only abnormal results are displayed) Labs Reviewed  SARS CORONAVIRUS 2 (TAT 6-24 HRS)  POCT RAPID STREP A (OFFICE)  POCT INFLUENZA A/B    EKG   Radiology No results found.  Procedures Procedures (including critical care time)  Medications Ordered in UC Medications - No data to display  Initial Impression / Assessment and Plan / UC Course  I have reviewed the triage vital signs and the nursing notes.  Pertinent labs & imaging results that were available during my care of the patient were reviewed by me and considered in my medical decision making (see chart for details).    Viral illness, viral pharyngitis, Elevated blood pressure with hypertension.  Rapid strep negative.  Rapid flu negative.  COVID pending.  Discussed symptomatic treatment including Tylenol, rest, hydration.  Instructed patient to follow up with her PCP if symptoms are not improving.  Also discussed with patient that her blood pressure is elevated today and needs to be rechecked by PCP in 2 to 4 weeks.  Education provided on managing hypertension.  She agrees to plan of care.   Final Clinical Impressions(s) / UC Diagnoses   Final diagnoses:  Viral illness  Viral pharyngitis  Elevated blood pressure reading in office with diagnosis of hypertension     Discharge Instructions      Your strep and flu tests are negative.  Your COVID test is pending.    Take Tylenol as needed for fever or discomfort.  Rest and keep yourself hydrated.  Follow-up with your primary care provider if your symptoms are not improving.    Your blood pressure is elevated today at 135/96.  Please have this rechecked by your primary care provider in 2-4 weeks.          ED Prescriptions   None    PDMP not reviewed this encounter.   Sharion Balloon, NP 12/11/22 1046

## 2022-12-11 NOTE — Discharge Instructions (Addendum)
Your strep and flu tests are negative.  Your COVID test is pending.    Take Tylenol as needed for fever or discomfort.  Rest and keep yourself hydrated.  Follow-up with your primary care provider if your symptoms are not improving.    Your blood pressure is elevated today at 135/96.  Please have this rechecked by your primary care provider in 2-4 weeks.

## 2022-12-11 NOTE — ED Triage Notes (Signed)
Pt presents to uc with co of sore throat and fevers with lymphadenopathy. Recent tele visit with recommendation to come in for strep swab, pt has been taking day quill and then switched to naproxen

## 2022-12-12 LAB — SARS CORONAVIRUS 2 (TAT 6-24 HRS): SARS Coronavirus 2: NEGATIVE

## 2023-03-04 ENCOUNTER — Other Ambulatory Visit: Payer: Self-pay | Admitting: Physician Assistant

## 2023-03-04 DIAGNOSIS — I10 Essential (primary) hypertension: Secondary | ICD-10-CM

## 2023-04-24 ENCOUNTER — Ambulatory Visit: Payer: BC Managed Care – PPO | Admitting: Family Medicine

## 2023-04-24 VITALS — BP 118/78 | HR 110 | Wt 231.5 lb

## 2023-04-24 DIAGNOSIS — R7309 Other abnormal glucose: Secondary | ICD-10-CM | POA: Diagnosis not present

## 2023-04-24 DIAGNOSIS — I10 Essential (primary) hypertension: Secondary | ICD-10-CM | POA: Diagnosis not present

## 2023-04-24 DIAGNOSIS — R635 Abnormal weight gain: Secondary | ICD-10-CM | POA: Insufficient documentation

## 2023-04-24 DIAGNOSIS — E782 Mixed hyperlipidemia: Secondary | ICD-10-CM | POA: Diagnosis not present

## 2023-04-24 NOTE — Assessment & Plan Note (Signed)
Chronic; stable Continue lisinopril 10 mg daily Repeat CBC and CMP At goal without known ASCVD/T2DM of <140/<90

## 2023-04-24 NOTE — Assessment & Plan Note (Signed)
Chronic; mixed Not at goal Remains on OTC fish oil supps Repeat FLP LDL goal <100 Trig goal <150 Total goal <200 The 10-year ASCVD risk score (Arnett DK, et al., 2019) is: 0.8% I continue to recommend diet low in saturated fat and regular exercise - 30 min at least 5 times per week

## 2023-04-24 NOTE — Assessment & Plan Note (Signed)
Previously elevated/impaired fasting glucose Repeat A1c given weight gain and other chronic conditions Notes maternal uncle and brother with concern for diabetes/pre-diabetes Continue to recommend balanced, lower carb meals. Smaller meal size, adding snacks. Choosing water as drink of choice and increasing purposeful exercise.

## 2023-04-24 NOTE — Assessment & Plan Note (Signed)
20# weight gain in 5 months Discussed importance of healthy weight management Discussed diet and exercise

## 2023-04-24 NOTE — Progress Notes (Signed)
Established patient visit   Patient: Christina [name]   DOB: 11/02/1980   42 y.o. Female  MRN: [id number] Visit Date: 04/24/2023  Today's healthcare provider: Jacky Kindle, FNP  Introduced to nurse practitioner role and practice setting.  All questions answered.  Discussed provider/patient relationship and expectations.  Switching PCP from [place], [place] as Drubel, PA is no longer at [facility]  Chief Complaint  Patient presents with   Medical Management of Chronic Issues    Patient is present for htn and hld f/u. She was last seen on 07/19/22 and advised to continue lisinopril 10 mg and to use metamucil the fiber supplement and a fish oil supplement. Patient reports taking medication as prescribed with no s/e. She reports not checking her bp at home and consuming a general diet. No symptoms to report.   Subjective    HPI HPI     Medical Management of Chronic Issues    Additional comments: Patient is present for htn and hld f/u. She was last seen on 07/19/22 and advised to continue lisinopril 10 mg and to use metamucil the fiber supplement and a fish oil supplement. Patient reports taking medication as prescribed with no s/e. She reports not checking her bp at home and consuming a general diet. No symptoms to report.      Last edited by Acey Lav, CMA on 04/24/2023  9:37 AM.      Medications: Outpatient Medications Prior to Visit  Medication Sig   Azelastine-Fluticasone 137-50 MCG/ACT SUSP PLACE 1 SPRAY INTO THE NOSE EVERY 12 (TWELVE) HOURS.   escitalopram (LEXAPRO) 10 MG tablet Take 20 mg by mouth daily.   esomeprazole (NEXIUM) 40 MG capsule Take 40 mg by mouth daily at 12 noon.   fexofenadine (ALLEGRA) 180 MG tablet TAKE 1 TABLET BY MOUTH EVERY DAY   guanFACINE (INTUNIV) 1 MG TB24 ER tablet SMARTSIG:1 Tablet(s) By Mouth Every Evening   HORIZANT 600 MG TBCR Take 1 tablet by mouth daily.   lisinopril (ZESTRIL) 10 MG tablet TAKE 1 TABLET BY MOUTH EVERY DAY   NIKKI  3-0.02 MG tablet Take 1 tablet by mouth daily.   tiZANidine (ZANAFLEX) 4 MG tablet Take 8 mg by mouth at bedtime.   VYVANSE 30 MG capsule Take 30 mg by mouth daily.   zolpidem (AMBIEN) 10 MG tablet Take 10 mg by mouth at bedtime as needed.   triamcinolone (KENALOG) 0.025 % ointment Apply 1 Application topically daily. On area of concern, use sparingly (Patient not taking: Reported on 04/24/2023)   [DISCONTINUED] clotrimazole-betamethasone (LOTRISONE) cream Apply 1 Application topically daily. Use sparingly (Patient not taking: Reported on 04/24/2023)   No facility-administered medications prior to visit.       Objective    BP 118/78 (BP Location: Right Arm, Patient Position: Sitting, Cuff Size: Large)   Pulse (!) 110   Wt 231 lb 8 oz (105 kg)   BMI 36.26 kg/m   Physical Exam Vitals and nursing note reviewed.  Constitutional:      General: She is not in acute distress.    Appearance: Normal appearance. She is obese. She is not ill-appearing, toxic-appearing or diaphoretic.  HENT:     Head: Normocephalic and atraumatic.  Cardiovascular:     Rate and Rhythm: Regular rhythm. Tachycardia present.     Pulses: Normal pulses.     Heart sounds: Normal heart sounds. No murmur heard.    No friction rub. No gallop.  Pulmonary:     Effort: Pulmonary  effort is normal. No respiratory distress.     Breath sounds: Normal breath sounds. No stridor. No wheezing, rhonchi or rales.  Chest:     Chest wall: No tenderness.  Musculoskeletal:        General: No swelling, tenderness, deformity or signs of injury. Normal range of motion.     Right lower leg: No edema.     Left lower leg: No edema.  Skin:    General: Skin is warm and dry.     Capillary Refill: Capillary refill takes less than 2 seconds.     Coloration: Skin is not jaundiced or pale.     Findings: No bruising, erythema, lesion or rash.  Neurological:     General: No focal deficit present.     Mental Status: She is alert and oriented  to person, place, and time. Mental status is at baseline.     Cranial Nerves: No cranial nerve deficit.     Sensory: No sensory deficit.     Motor: No weakness.     Coordination: Coordination normal.  Psychiatric:        Mood and Affect: Mood is anxious.        Behavior: Behavior normal.        Thought Content: Thought content normal.        Judgment: Judgment normal.     No results found for any visits on 04/24/23.  Assessment & Plan     Problem List Items Addressed This Visit       Cardiovascular and Mediastinum   HTN (hypertension) - Primary    Chronic; stable Continue lisinopril 10 mg daily Repeat CBC and CMP At goal without known ASCVD/T2DM of <140/<90      Relevant Orders   CBC with Differential/Platelet   Comprehensive Metabolic Panel (CMET)     Other   Elevated glucose    Previously elevated/impaired fasting glucose Repeat A1c given weight gain and other chronic conditions Notes maternal uncle and brother with concern for diabetes/pre-diabetes Continue to recommend balanced, lower carb meals. Smaller meal size, adding snacks. Choosing water as drink of choice and increasing purposeful exercise.       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     Leilani Merl, FNP, have reviewed all documentation for this visit. The documentation on 04/24/23 for the exam, diagnosis, procedures, and orders are all accurate  and complete.  Jacky Kindle, FNP  [facility] [phone] (phone) [phone] (fax)  [facility]

## 2023-04-24 NOTE — Patient Instructions (Signed)
Please contact [phone] to schedule your mammogram. You will be asked your location preference to have procedure performed. You have two options listed below.  1) [facility] located at [address] [place], [place] [address] 2) [facility] [place] located at [address] [place],  [address]  Upon results being received our office will contact you. As well as all results can be viewed through your MyChart. Please feel free to contact us if you have any further questions or concerns.   

## 2023-04-24 NOTE — Assessment & Plan Note (Signed)
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

## 2023-04-25 LAB — CBC WITH DIFFERENTIAL/PLATELET
Basophils Absolute: 0.1 10*3/uL (ref 0.0–0.2)
Basos: 1 %
EOS (ABSOLUTE): 0.1 10*3/uL (ref 0.0–0.4)
Eos: 1 %
Hematocrit: 41.4 % (ref 34.0–46.6)
Hemoglobin: 13.5 g/dL (ref 11.1–15.9)
Immature Grans (Abs): 0 10*3/uL (ref 0.0–0.1)
Immature Granulocytes: 0 %
Lymphocytes Absolute: 2.7 10*3/uL (ref 0.7–3.1)
Lymphs: 25 %
MCH: 28.4 pg (ref 26.6–33.0)
MCHC: 32.6 g/dL (ref 31.5–35.7)
MCV: 87 fL (ref 79–97)
Monocytes Absolute: 0.6 10*3/uL (ref 0.1–0.9)
Monocytes: 6 %
Neutrophils Absolute: 7.1 10*3/uL — ABNORMAL HIGH (ref 1.4–7.0)
Neutrophils: 67 %
Platelets: 340 10*3/uL (ref 150–450)
RBC: 4.76 x10E6/uL (ref 3.77–5.28)
RDW: 13 % (ref 11.7–15.4)
WBC: 10.5 10*3/uL (ref 3.4–10.8)

## 2023-04-25 LAB — COMPREHENSIVE METABOLIC PANEL
ALT: 16 IU/L (ref 0–32)
AST: 14 IU/L (ref 0–40)
Albumin: 4.5 g/dL (ref 3.9–4.9)
Alkaline Phosphatase: 68 IU/L (ref 44–121)
BUN/Creatinine Ratio: 17 (ref 9–23)
BUN: 13 mg/dL (ref 6–24)
Bilirubin Total: 0.2 mg/dL (ref 0.0–1.2)
CO2: 19 mmol/L — ABNORMAL LOW (ref 20–29)
Calcium: 9.3 mg/dL (ref 8.7–10.2)
Chloride: 100 mmol/L (ref 96–106)
Creatinine, Ser: 0.78 mg/dL (ref 0.57–1.00)
Globulin, Total: 2.8 g/dL (ref 1.5–4.5)
Glucose: 89 mg/dL (ref 70–99)
Potassium: 4.8 mmol/L (ref 3.5–5.2)
Sodium: 137 mmol/L (ref 134–144)
Total Protein: 7.3 g/dL (ref 6.0–8.5)
eGFR: 97 mL/min/{1.73_m2} (ref >59)

## 2023-04-25 LAB — LIPID PANEL
Chol/HDL Ratio: 2.8 ratio (ref 0.0–4.4)
Cholesterol, Total: 236 mg/dL — ABNORMAL HIGH (ref 100–199)
HDL: 84 mg/dL (ref >39)
LDL Chol Calc (NIH): 118 mg/dL — ABNORMAL HIGH (ref 0–99)
Triglycerides: 202 mg/dL — ABNORMAL HIGH (ref 0–149)
VLDL Cholesterol Cal: 34 mg/dL (ref 5–40)

## 2023-04-25 LAB — HEMOGLOBIN A1C
Est. average glucose Bld gHb Est-mCnc: 114 mg/dL
Hgb A1c MFr Bld: 5.6 % (ref 4.8–5.6)

## 2023-04-25 NOTE — Progress Notes (Signed)
Cholesterol remains elevated in total, fat and LDL. However, LDL is improved as well as HDL from use of omega 3s. The 10-year ASCVD risk score (Arnett DK, et al., 2019) is: 0.5% I continue to recommend diet low in saturated fat and regular exercise - 30 min at least 5 times per week  All other labs stable

## 2023-09-02 ENCOUNTER — Encounter: Payer: Self-pay | Admitting: Family Medicine

## 2023-09-04 ENCOUNTER — Other Ambulatory Visit: Payer: Self-pay | Admitting: Family Medicine

## 2023-09-04 ENCOUNTER — Other Ambulatory Visit: Payer: Self-pay

## 2023-09-04 DIAGNOSIS — Z3009 Encounter for other general counseling and advice on contraception: Secondary | ICD-10-CM

## 2023-09-04 MED ORDER — NIKKI 3-0.02 MG PO TABS
1.0000 | ORAL_TABLET | Freq: Every day | ORAL | 0 refills | Status: AC
Start: 2023-09-04 — End: ?

## 2023-09-04 NOTE — Telephone Encounter (Signed)
LOV 04/27/23 NOV none LRF 10/23/22 by Lillia Abed

## 2023-09-04 NOTE — Telephone Encounter (Signed)
Medication Refill -  Most Recent Primary Care Visit:  Provider: Merita Norton T  Department: [facility]  Visit Type: OFFICE VISIT  Date: 04/24/2023   Medication: NIKKI 3-0.02 MG tablet    Has the patient contacted their pharmacy? Yes   Is this the correct pharmacy for this prescription? Yes If no, delete pharmacy and type the correct one.  This is the patient's preferred pharmacy:   [facility] [phone] [facility], [place] - [address] [place] [address] Phone: [phone] Fax: [phone]   Has the prescription been filled recently? No  Is the patient out of the medication? No she has a few left   Has the patient been seen for an appointment in the last year OR does the patient have an upcoming appointment? Yes  Can we respond through MyChart? No  Agent: Please be advised that Rx refills may take up to 3 business days. We ask that you follow-up with your pharmacy.

## 2023-09-04 NOTE — Telephone Encounter (Signed)
Duplicate request- filled today Requested Prescriptions  Pending Prescriptions Disp Refills   Christina [name] 3-0.02 MG tablet 84 tablet 0    Sig: Take 1 tablet by mouth daily. Please schedule your annual appt. [phone] [facility]     OB/GYN:  Contraceptives Passed - 09/04/2023 12:16 PM      Passed - Last BP in normal range    BP Readings from Last 1 Encounters:  04/24/23 118/78         Passed - Valid encounter within last 12 months    Recent Outpatient Visits           4 months ago Primary hypertension   [facility] Jacky Kindle, FNP   9 months ago Abscess   [facility] Alfredia Ferguson, PA-C   10 months ago Dysuria   [facility] Alfredia Ferguson, PA-C   10 months ago Encounter for counseling regarding contraception   [facility] Alfredia Ferguson, PA-C   1 year ago Annual physical exam   [facility] Alfredia Ferguson, [place]              Passed - Patient is not a smoker

## 2023-09-21 ENCOUNTER — Other Ambulatory Visit: Payer: Self-pay | Admitting: Physician Assistant

## 2023-09-21 DIAGNOSIS — R0981 Nasal congestion: Secondary | ICD-10-CM

## 2023-09-21 DIAGNOSIS — R0982 Postnasal drip: Secondary | ICD-10-CM

## 2023-09-21 NOTE — Telephone Encounter (Signed)
Requested Prescriptions  Pending Prescriptions Disp Refills   Azelastine-Fluticasone 137-50 MCG/ACT SUSP [Pharmacy Med Name: AZELASTIN-FLUTIC 137-50MCG SPR] 23 g 1    Sig: PLACE 1 SPRAY INTO THE NOSE EVERY 12 (TWELVE) HOURS.     Ear, Nose, and Throat: Nasal Preparations - Corticosteroids Passed - 09/21/2023  4:27 PM      Passed - Valid encounter within last 12 months    Recent Outpatient Visits           5 months ago Primary hypertension   [facility] Jacky Kindle, FNP   10 months ago Abscess   [facility] Alfredia Ferguson, PA-C   10 months ago Dysuria   [facility] Alfredia Ferguson, PA-C   11 months ago Encounter for counseling regarding contraception   [facility] Alfredia Ferguson, PA-C   1 year ago Annual physical exam   [facility] Alfredia Ferguson, [place]

## 2023-11-26 ENCOUNTER — Telehealth: Payer: Self-pay | Admitting: Physician Assistant

## 2023-11-26 NOTE — Telephone Encounter (Signed)
[facility] pharmacy is requesting refill Christina [name] 3-0.02 MG tablet   Please advise

## 2023-11-27 ENCOUNTER — Other Ambulatory Visit: Payer: Self-pay

## 2023-11-27 DIAGNOSIS — Z3009 Encounter for other general counseling and advice on contraception: Secondary | ICD-10-CM

## 2023-12-20 ENCOUNTER — Ambulatory Visit: Payer: Self-pay | Admitting: Internal Medicine

## 2023-12-20 ENCOUNTER — Encounter: Payer: Self-pay | Admitting: Internal Medicine

## 2023-12-20 ENCOUNTER — Other Ambulatory Visit: Payer: Self-pay

## 2023-12-20 VITALS — BP 132/82 | HR 96 | Temp 98.2°F | Resp 16 | Ht 67.0 in | Wt 233.2 lb

## 2023-12-20 DIAGNOSIS — I10 Essential (primary) hypertension: Secondary | ICD-10-CM

## 2023-12-20 DIAGNOSIS — R0982 Postnasal drip: Secondary | ICD-10-CM | POA: Insufficient documentation

## 2023-12-20 DIAGNOSIS — Z1211 Encounter for screening for malignant neoplasm of colon: Secondary | ICD-10-CM

## 2023-12-20 DIAGNOSIS — F419 Anxiety disorder, unspecified: Secondary | ICD-10-CM | POA: Diagnosis not present

## 2023-12-20 DIAGNOSIS — E782 Mixed hyperlipidemia: Secondary | ICD-10-CM | POA: Diagnosis not present

## 2023-12-20 DIAGNOSIS — Z1231 Encounter for screening mammogram for malignant neoplasm of breast: Secondary | ICD-10-CM

## 2023-12-20 MED ORDER — LISINOPRIL 20 MG PO TABS
20.0000 mg | ORAL_TABLET | Freq: Every day | ORAL | 1 refills | Status: DC
Start: 2023-12-20 — End: 2024-04-21

## 2023-12-20 MED ORDER — AZELASTINE-FLUTICASONE 137-50 MCG/ACT NA SUSP
1.0000 | Freq: Two times a day (BID) | NASAL | 3 refills | Status: DC
Start: 2023-12-20 — End: 2024-06-10

## 2023-12-20 NOTE — Assessment & Plan Note (Signed)
 Reviewed last cholesterol panel with the patient, LDL elevated but ASCVD risk 0.6%.  Plan to recheck fasting labs at follow-up.  Discussed dietary changes like decreasing red meats and fatty processed foods in the diet in the meantime.

## 2023-12-20 NOTE — Assessment & Plan Note (Signed)
 Blood pressure stable here today, no changes made to medications and appropriate refills sent to pharmacy.

## 2023-12-20 NOTE — Progress Notes (Signed)
 New Patient Office Visit  Subjective    Patient ID: Christina [name], female    DOB: 1981-08-31  Age: 43 y.o. MRN: [id number]  CC:  Chief Complaint  Patient presents with   Establish Care    HPI Christina [name] presents to establish care.  Hypertension: -Medications: Lisinopril 20 mg, has stable been on for 5 years  -Patient is compliant with above medications and reports no side effects. -Denies any SOB, CP, vision changes, LE edema or symptoms of hypotension  HLD: -Medications: Nothing  -Last lipid panel: Lipid Panel     Component Value Date/Time   CHOL 236 (H) 04/24/2023 1034   TRIG 202 (H) 04/24/2023 1034   HDL 84 04/24/2023 1034   CHOLHDL 2.8 04/24/2023 1034   LDLCALC 118 (H) 04/24/2023 1034   LABVLDL 34 04/24/2023 1034   The 10-year ASCVD risk score (Arnett DK, et al., 2019) is: 0.6%   Values used to calculate the score:     Age: 40 years     Sex: Female     Is Non-Hispanic African American: No     Diabetic: No     Tobacco smoker: No     Systolic Blood Pressure: 132 mmHg     Is BP treated: Yes     HDL Cholesterol: 84 mg/dL     Total Cholesterol: 236 mg/dL  Contraception:  -Currently on Nikki OCP, having periods every 3 months -Ordering with a provider online  Anxiety/ADHD/MDD/Insomnia: -Following with psychiatry and therapy regularly -Currently Lexapro 10 mg, Guanfacine 1 mg ER, Vyvanse  30 mg, Rexulti 0.5 mg, Horizant 600 mg, Ambien 10 mg PRN for insomnia   Health maintenance: -Blood work up-to-date -Mammogram ordered -Colon cancer screening: Patient has not had colon cancer screening yet, however she has a strong family history of colon cancer.  She had an uncle who passed away from colon cancer in his 55s and both of her parents and her brother have had multiple polyps  Outpatient Encounter Medications as of 12/20/2023  Medication Sig   escitalopram (LEXAPRO) 10 MG tablet Take 20 mg by mouth daily.   esomeprazole (NEXIUM) 40 MG  capsule Take 40 mg by mouth daily at 12 noon.   fexofenadine (ALLEGRA) 180 MG tablet TAKE 1 TABLET BY MOUTH EVERY DAY   guanFACINE (INTUNIV) 1 MG TB24 ER tablet SMARTSIG:1 Tablet(s) By Mouth Every Evening   HORIZANT 600 MG TBCR Take 1 tablet by mouth daily.   NIKKI 3-0.02 MG tablet Take 1 tablet by mouth daily. Please schedule your annual appt. [phone] [facility]   REXULTI 0.5 MG TABS 0.5 mg.   tiZANidine (ZANAFLEX) 4 MG tablet Take 8 mg by mouth at bedtime.   VYVANSE 30 MG capsule Take 30 mg by mouth daily.   zolpidem (AMBIEN) 10 MG tablet Take 10 mg by mouth at bedtime as needed.   [DISCONTINUED] Azelastine-Fluticasone 137-50 MCG/ACT SUSP PLACE 1 SPRAY INTO THE NOSE EVERY 12 (TWELVE) HOURS.   [DISCONTINUED] lisinopril (ZESTRIL) 20 MG tablet Take 20 mg by mouth daily.   Azelastine-Fluticasone 137-50 MCG/ACT SUSP Place 1 spray into the nose every 12 (twelve) hours.   lisinopril (ZESTRIL) 20 MG tablet Take 1 tablet (20 mg total) by mouth daily.   [DISCONTINUED] lisinopril (ZESTRIL) 10 MG tablet TAKE 1 TABLET BY MOUTH EVERY DAY (Patient not taking: Reported on 12/20/2023)   No facility-administered encounter medications on file as of 12/20/2023.    Past Medical History:  Diagnosis Date   Allergy  Anemia    Anxiety    GERD (gastroesophageal reflux disease)    Hyperlipidemia    Hypertension    Medical history non-contributory    Postpartum care following vaginal delivery (5/5) 02/11/2016   Pregnancy induced hypertension    Preterm labor    Second degree perineal laceration 08/09/2017   Vaginal Pap smear, abnormal     Past Surgical History:  Procedure Laterality Date   MANDIBLE SURGERY      Family History  Problem Relation Age of Onset   Hyperlipidemia Mother    Hypertension Mother    Cataracts Mother    Hypertension Father    Hyperlipidemia Father    Anemia Father    Hypertension Brother    Hyperlipidemia Brother    Hypertension Maternal Grandmother     Hyperlipidemia Maternal Grandmother    Dementia Maternal Grandmother    Colon cancer Maternal Grandfather    Hypertension Paternal Grandmother    Hyperlipidemia Paternal Grandmother    Rheum arthritis Neg Hx    Osteoarthritis Neg Hx     Social History   Socioeconomic History   Marital status: Married    Spouse name: Not on file   Number of children: Not on file   Years of education: Not on file   Highest education level: Master's degree (e.g., MA, MS, MEng, MEd, MSW, MBA)  Occupational History   Not on file  Tobacco Use   Smoking status: Never    Passive exposure: Never   Smokeless tobacco: Never  Vaping Use   Vaping status: Never Used  Substance and Sexual Activity   Alcohol use: No   Drug use: No   Sexual activity: Yes  Other Topics Concern   Not on file  Social History Narrative   Not on file   Social Drivers of Health   Financial Resource Strain: Low Risk  (04/24/2023)   Overall Financial Resource Strain (CARDIA)    Difficulty of Paying Living Expenses: Not hard at all  Food Insecurity: No Food Insecurity (04/24/2023)   Hunger Vital Sign    Worried About Running Out of Food in the Last Year: Never true    Ran Out of Food in the Last Year: Never true  Transportation Needs: No Transportation Needs (04/24/2023)   PRAPARE - Administrator, Civil Service (Medical): No    Lack of Transportation (Non-Medical): No  Physical Activity: Insufficiently Active (04/24/2023)   Exercise Vital Sign    Days of Exercise per Week: 2 days    Minutes of Exercise per Session: 20 min  Stress: Stress Concern Present (04/24/2023)   [employer] of Occupational Health - Occupational Stress Questionnaire    Feeling of Stress : Rather much  Social Connections: Moderately Integrated (04/24/2023)   Social Connection and Isolation Panel [NHANES]    Frequency of Communication with Friends and Family: More than three times a week    Frequency of Social Gatherings with Friends and  Family: Twice a week    Attends Religious Services: 1 to 4 times per year    Active Member of [employer] or Organizations: No    Attends Engineer, structural: Not on file    Marital Status: Married  Catering manager Violence: Not on file    Review of Systems  All other systems reviewed and are negative.       Objective    BP 132/82 (Cuff Size: Large)   Pulse 96   Temp 98.2 F (36.8 C) (Oral)   Resp 16  Ht 5\' 7"  (1.702 m)   Wt 233 lb 3.2 oz (105.8 kg)   LMP 10/25/2023   SpO2 96%   BMI 36.52 kg/m   Physical Exam Constitutional:      Appearance: Normal appearance.  HENT:     Head: Normocephalic and atraumatic.     Mouth/Throat:     Mouth: Mucous membranes are moist.     Pharynx: Oropharynx is clear.  Eyes:     Extraocular Movements: Extraocular movements intact.     Conjunctiva/sclera: Conjunctivae normal.     Pupils: Pupils are equal, round, and reactive to light.  Neck:     Comments: No thyromegaly Cardiovascular:     Rate and Rhythm: Normal rate and regular rhythm.  Pulmonary:     Effort: Pulmonary effort is normal.     Breath sounds: Normal breath sounds.  Musculoskeletal:     Cervical back: No tenderness.     Right lower leg: No edema.     Left lower leg: No edema.  Lymphadenopathy:     Cervical: No cervical adenopathy.  Skin:    General: Skin is warm and dry.  Neurological:     General: No focal deficit present.     Mental Status: She is alert. Mental status is at baseline.  Psychiatric:        Mood and Affect: Mood normal.        Behavior: Behavior normal.         Assessment & Plan:  Primary hypertension Assessment & Plan: Blood pressure stable here today, no changes made to medications and appropriate refills sent to pharmacy.    Orders: -     Lisinopril; Take 1 tablet (20 mg total) by mouth daily.  Dispense: 90 tablet; Refill: 1  Moderate mixed hyperlipidemia not requiring statin therapy Assessment & Plan: Reviewed last  cholesterol panel with the patient, LDL elevated but ASCVD risk 0.6%.  Plan to recheck fasting labs at follow-up.  Discussed dietary changes like decreasing red meats and fatty processed foods in the diet in the meantime.   Anxiety Assessment & Plan: Following with psychiatry and therapy, medications reviewed.   Post-nasal drip Assessment & Plan: Has chronic sinus drainage due to mold allergies, refill antihistamine/steroid nasal spray.  Patient also takes over-the-counter Allegra for her symptoms.  Orders: -     Azelastine-Fluticasone; Place 1 spray into the nose every 12 (twelve) hours.  Dispense: 23 g; Refill: 3  Encounter for screening mammogram for malignant neoplasm of breast -     3D Screening Mammogram, Left and Right; Future  Colon cancer screening -     Ambulatory referral to Gastroenterology    Return in about 4 months (around 04/20/2024).   Margarita Mail, DO

## 2023-12-20 NOTE — Assessment & Plan Note (Signed)
 Following with psychiatry and therapy, medications reviewed.

## 2023-12-20 NOTE — Assessment & Plan Note (Signed)
 Has chronic sinus drainage due to mold allergies, refill antihistamine/steroid nasal spray.  Patient also takes over-the-counter Allegra for her symptoms.

## 2024-02-20 ENCOUNTER — Encounter: Payer: Self-pay | Admitting: *Deleted

## 2024-04-21 ENCOUNTER — Ambulatory Visit: Admitting: Internal Medicine

## 2024-04-21 ENCOUNTER — Encounter: Payer: Self-pay | Admitting: Internal Medicine

## 2024-04-21 ENCOUNTER — Other Ambulatory Visit: Payer: Self-pay

## 2024-04-21 VITALS — BP 118/78 | HR 96 | Temp 97.9°F | Resp 16 | Ht 67.0 in | Wt 239.6 lb

## 2024-04-21 DIAGNOSIS — I10 Essential (primary) hypertension: Secondary | ICD-10-CM

## 2024-04-21 DIAGNOSIS — Z13220 Encounter for screening for lipoid disorders: Secondary | ICD-10-CM

## 2024-04-21 DIAGNOSIS — Z1159 Encounter for screening for other viral diseases: Secondary | ICD-10-CM

## 2024-04-21 DIAGNOSIS — Z833 Family history of diabetes mellitus: Secondary | ICD-10-CM

## 2024-04-21 DIAGNOSIS — R350 Frequency of micturition: Secondary | ICD-10-CM | POA: Diagnosis not present

## 2024-04-21 LAB — POCT URINALYSIS DIPSTICK
Bilirubin, UA: NEGATIVE
Blood, UA: NEGATIVE
Glucose, UA: NEGATIVE
Ketones, UA: NEGATIVE
Nitrite, UA: NEGATIVE
Protein, UA: NEGATIVE
Spec Grav, UA: 1.02 (ref 1.010–1.025)
Urobilinogen, UA: 0.2 U/dL
pH, UA: 5 (ref 5.0–8.0)

## 2024-04-21 MED ORDER — LISINOPRIL 20 MG PO TABS: 20.0000 mg | ORAL_TABLET | Freq: Every day | ORAL | 3 refills | Status: AC

## 2024-04-21 NOTE — Progress Notes (Signed)
 Established Patient Office Visit  Subjective    Patient ID: Christina [name], female    DOB: 17-Oct-1980  Age: 42 y.o. MRN: [id number]  CC:  Chief Complaint  Patient presents with   Medical Management of Chronic Issues    4 month recheck    HPI Christina [name] presents for follow up on chronic medical conditions.   Discussed the use of AI scribe software for clinical note transcription with the patient, who gave verbal consent to proceed.  History of Present Illness Christina [name] is a 43 year old female who presents for an annual physical exam and concerns about urinary incontinence.  [name] experiences urinary incontinence, with increased urgency to urinate over the past year, sometimes leading to accidents if she cannot reach the bathroom in time. The urgency is immediate and not linked to stress incontinence, with only occasional leakage when sneezing or laughing. There are no symptoms of overactive bladder, such as frequent urination without complete emptying.  Her blood pressure is well-controlled with lisinopril . She manages allergies with Allegra  and uses Nexium for acid reflux. She is on birth control obtained online and uses tizanidine for muscle tension related to stress and teeth grinding.  She has a family history of prediabetes, with her father and brother recently starting medication. Her A1c levels have been normal, but she requests annual monitoring due to her family history.   Hypertension: -Medications: Lisinopril  20 mg, has stable been on for 5 years  -Patient is compliant with above medications and reports no side effects. -Denies any SOB, CP, vision changes, LE edema or symptoms of hypotension  HLD: -Medications: Nothing  -Last lipid panel: Lipid Panel     Component Value Date/Time   CHOL 236 (H) 04/24/2023 1034   TRIG 202 (H) 04/24/2023 1034   HDL 84 04/24/2023 1034   CHOLHDL 2.8 04/24/2023 1034   LDLCALC 118 (H) 04/24/2023  1034   LABVLDL 34 04/24/2023 1034   The 10-year ASCVD risk score (Arnett DK, et al., 2019) is: 0.7%   Values used to calculate the score:     Age: 42 years     Clincally relevant sex: Female     Is Non-Hispanic African American: No     Diabetic: No     Tobacco smoker: No     Systolic Blood Pressure: 132 mmHg     Is BP treated: Yes     HDL Cholesterol: 84 mg/dL     Total Cholesterol: 236 mg/dL  Contraception:  -Currently on Nikki  OCP, having periods every 3 months -Ordering with a provider online  Anxiety/ADHD/MDD/Insomnia: -Following with psychiatry and therapy regularly -Currently Lexapro 10 mg, Guanfacine 1 mg ER, Vyvanse  30 mg, Rexulti 0.5 mg, Horizant 600 mg, Ambien  10 mg PRN for insomnia   Health maintenance: -Blood work due -Mammogram ordered previously, will call to schedule -Colon cancer screening: Patient has not had colon cancer screening yet, however she has a strong family history of colon cancer.  She had an uncle who passed away from colon cancer in his 74s and both of her parents and her brother have had multiple polyps. Referral placed previously, patient will call and schedule  Outpatient Encounter Medications as of 04/21/2024  Medication Sig   Azelastine -Fluticasone  137-50 MCG/ACT SUSP Place 1 spray into the nose every 12 (twelve) hours.   escitalopram (LEXAPRO) 10 MG tablet Take 20 mg by mouth daily.   esomeprazole (NEXIUM) 40 MG capsule Take 40 mg by mouth daily at 12 noon.  fexofenadine  (ALLEGRA ) 180 MG tablet TAKE 1 TABLET BY MOUTH EVERY DAY   guanFACINE (INTUNIV) 1 MG TB24 ER tablet SMARTSIG:1 Tablet(s) By Mouth Every Evening   HORIZANT 600 MG TBCR Take 1 tablet by mouth daily.   lisinopril  (ZESTRIL ) 20 MG tablet Take 1 tablet (20 mg total) by mouth daily.   NIKKI  3-0.02 MG tablet Take 1 tablet by mouth daily. Please schedule your annual appt. [phone] [facility]   REXULTI 0.5 MG TABS 0.5 mg.   tiZANidine (ZANAFLEX) 4 MG tablet Take 8 mg by  mouth at bedtime.   VYVANSE 30 MG capsule Take 30 mg by mouth daily.   zolpidem  (AMBIEN ) 10 MG tablet Take 10 mg by mouth at bedtime as needed.   No facility-administered encounter medications on file as of 04/21/2024.    Past Medical History:  Diagnosis Date   Allergy    Anemia    Anxiety    GERD (gastroesophageal reflux disease)    Hyperlipidemia    Hypertension    Medical history non-contributory    Postpartum care following vaginal delivery (5/5) 02/11/2016   Pregnancy induced hypertension    Preterm labor    Second degree perineal laceration 08/09/2017   Vaginal Pap smear, abnormal     Past Surgical History:  Procedure Laterality Date   MANDIBLE SURGERY      Family History  Problem Relation Age of Onset   Hyperlipidemia Mother    Hypertension Mother    Cataracts Mother    Hypertension Father    Hyperlipidemia Father    Anemia Father    Hypertension Brother    Hyperlipidemia Brother    Hypertension Maternal Grandmother    Hyperlipidemia Maternal Grandmother    Dementia Maternal Grandmother    Colon cancer Maternal Grandfather    Hypertension Paternal Grandmother    Hyperlipidemia Paternal Grandmother    Rheum arthritis Neg Hx    Osteoarthritis Neg Hx     Social History   Socioeconomic History   Marital status: Married    Spouse name: Not on file   Number of children: Not on file   Years of education: Not on file   Highest education level: Master's degree (e.g., MA, MS, MEng, MEd, MSW, MBA)  Occupational History   Not on file  Tobacco Use   Smoking status: Never    Passive exposure: Never   Smokeless tobacco: Never  Vaping Use   Vaping status: Never Used  Substance and Sexual Activity   Alcohol use: No   Drug use: No   Sexual activity: Yes  Other Topics Concern   Not on file  Social History Narrative   Not on file   Social Drivers of Health   Financial Resource Strain: Low Risk  (04/24/2023)   Overall Financial Resource Strain (CARDIA)     Difficulty of Paying Living Expenses: Not hard at all  Food Insecurity: No Food Insecurity (04/24/2023)   Hunger Vital Sign    Worried About Running Out of Food in the Last Year: Never true    Ran Out of Food in the Last Year: Never true  Transportation Needs: No Transportation Needs (04/24/2023)   PRAPARE - Administrator, Civil Service (Medical): No    Lack of Transportation (Non-Medical): No  Physical Activity: Insufficiently Active (04/24/2023)   Exercise Vital Sign    Days of Exercise per Week: 2 days    Minutes of Exercise per Session: 20 min  Stress: Stress Concern Present (04/24/2023)   [employer]  of Occupational Health - Occupational Stress Questionnaire    Feeling of Stress : Rather much  Social Connections: Moderately Integrated (04/24/2023)   Social Connection and Isolation Panel    Frequency of Communication with Friends and Family: More than three times a week    Frequency of Social Gatherings with Friends and Family: Twice a week    Attends Religious Services: 1 to 4 times per year    Active Member of [employer] or Organizations: No    Attends Engineer, structural: Not on file    Marital Status: Married  Catering manager Violence: Not on file    Review of Systems  All other systems reviewed and are negative.       Objective    BP 118/78 (Cuff Size: Normal)   Pulse 96   Temp 97.9 F (36.6 C) (Oral)   Resp 16   Ht 5' 7 (1.702 m)   Wt 239 lb 9.6 oz (108.7 kg)   SpO2 98%   BMI 37.53 kg/m   Physical Exam Constitutional:      Appearance: Normal appearance.  HENT:     Head: Normocephalic and atraumatic.     Mouth/Throat:     Mouth: Mucous membranes are moist.     Pharynx: Oropharynx is clear.  Eyes:     Extraocular Movements: Extraocular movements intact.     Conjunctiva/sclera: Conjunctivae normal.     Pupils: Pupils are equal, round, and reactive to light.  Cardiovascular:     Rate and Rhythm: Normal rate and regular rhythm.   Pulmonary:     Effort: Pulmonary effort is normal.     Breath sounds: Normal breath sounds.  Musculoskeletal:     Right lower leg: No edema.     Left lower leg: No edema.  Skin:    General: Skin is warm and dry.  Neurological:     General: No focal deficit present.     Mental Status: She is alert. Mental status is at baseline.  Psychiatric:        Mood and Affect: Mood normal.        Behavior: Behavior normal.         Assessment & Plan:   Assessment & Plan Urinary Incontinence Symptoms indicate mixed stress and urge incontinence. Stress incontinence likely due to pelvic floor muscle weakening; urge incontinence involves sudden urge to urinate. She opted for conservative management to avoid medication side effects. - Perform urine dip to rule out infection or abnormalities. Normal with exception of trace leukocytes.  - Provided information on pelvic floor exercises, including Kegel exercises. - Advised regular performance of Kegel exercises. - Consider referral to urology or pelvic floor physical therapy if symptoms persist.  Hypertension Blood pressure controlled on lisinopril , recent reading 119/78 mmHg. Medication well-tolerated. - Continue lisinopril  prescription. - Labs due.   General Health Maintenance Annual blood work due, including cholesterol and A1c monitoring due to family diabetes history. Mammogram up to date, colonoscopy referral in place, vaccinations current. - Order annual blood work including cholesterol and A1c. - Ensure mammogram order valid until next March.  - CBC w/Diff/Platelet - Comprehensive Metabolic Panel (CMET) - lisinopril  (ZESTRIL ) 20 MG tablet; Take 1 tablet (20 mg total) by mouth daily.  Dispense: 90 tablet; Refill: 3 - Lipid Profile - Hepatitis C Antibody - HgB A1c - POCT Urinalysis Dipstick    Return in about 1 year (around 04/21/2025).   Sharyle Fischer, DO

## 2024-04-21 NOTE — Patient Instructions (Signed)
Kegel Exercises  Kegel exercises can help strengthen your pelvic floor muscles. The pelvic floor is a group of muscles that support your rectum, small intestine, and bladder. In females, pelvic floor muscles also help support the uterus. These muscles help you control the flow of urine and stool (feces). Kegel exercises are painless and simple. They do not require any equipment. Your provider may suggest Kegel exercises to: Improve bladder and bowel control. Improve sexual response. Improve weak pelvic floor muscles after surgery to remove the uterus (hysterectomy) or after pregnancy, in females. Improve weak pelvic floor muscles after prostate gland removal or surgery, in males. Kegel exercises involve squeezing your pelvic floor muscles. These are the same muscles you squeeze when you try to stop the flow of urine or keep from passing gas. The exercises can be done while sitting, standing, or lying down, but it is best to vary your position. Ask your health care provider which exercises are safe for you. Do exercises exactly as told by your health care provider and adjust them as directed. Do not begin these exercises until told by your health care provider. Exercises How to do Kegel exercises: Squeeze your pelvic floor muscles tight. You should feel a tight lift in your rectal area. If you are a female, you should also feel a tightness in your vaginal area. Keep your stomach, buttocks, and legs relaxed. Hold the muscles tight for up to 10 seconds. Breathe normally. Relax your muscles for up to 10 seconds. Repeat as told by your health care provider. Repeat this exercise daily as told by your health care provider. Continue to do this exercise for at least 4-6 weeks, or for as long as told by your health care provider. You may be referred to a physical therapist who can help you learn more about how to do Kegel exercises. Depending on your condition, your health care provider may  recommend: Varying how long you squeeze your muscles. Doing several sets of exercises every day. Doing exercises for several weeks. Making Kegel exercises a part of your regular exercise routine. This information is not intended to replace advice given to you by your health care provider. Make sure you discuss any questions you have with your health care provider. Document Revised: 02/03/2021 Document Reviewed: 02/03/2021 Elsevier Patient Education  2024 [employer]  

## 2024-04-22 LAB — LIPID PANEL
Cholesterol: 193 mg/dL (ref <200)
HDL: 69 mg/dL (ref >=50)
LDL Cholesterol (Calc): 103 mg/dL — ABNORMAL HIGH
Non-HDL Cholesterol (Calc): 124 mg/dL (ref <130)
Total CHOL/HDL Ratio: 2.8 (calc) (ref <5.0)
Triglycerides: 118 mg/dL (ref <150)

## 2024-04-22 LAB — COMPREHENSIVE METABOLIC PANEL WITH GFR
AG Ratio: 1.6 (calc) (ref 1.0–2.5)
ALT: 15 U/L (ref 6–29)
AST: 12 U/L (ref 10–30)
Albumin: 4.1 g/dL (ref 3.6–5.1)
Alkaline phosphatase (APISO): 58 U/L (ref 31–125)
BUN: 13 mg/dL (ref 7–25)
CO2: 23 mmol/L (ref 20–32)
Calcium: 9.3 mg/dL (ref 8.6–10.2)
Chloride: 105 mmol/L (ref 98–110)
Creat: 0.65 mg/dL (ref 0.50–0.99)
Globulin: 2.6 g/dL (ref 1.9–3.7)
Glucose, Bld: 106 mg/dL — ABNORMAL HIGH (ref 65–99)
Potassium: 4.5 mmol/L (ref 3.5–5.3)
Sodium: 140 mmol/L (ref 135–146)
Total Bilirubin: 0.2 mg/dL (ref 0.2–1.2)
Total Protein: 6.7 g/dL (ref 6.1–8.1)
eGFR: 112 mL/min/1.73m2 (ref >=60)

## 2024-04-22 LAB — CBC WITH DIFFERENTIAL/PLATELET
Absolute Lymphocytes: 2781 {cells}/uL (ref 850–3900)
Absolute Monocytes: 493 {cells}/uL (ref 200–950)
Basophils Absolute: 74 {cells}/uL (ref 0–200)
Basophils Relative: 0.8 %
Eosinophils Absolute: 102 {cells}/uL (ref 15–500)
Eosinophils Relative: 1.1 %
HCT: 36.9 % (ref 35.0–45.0)
Hemoglobin: 12.2 g/dL (ref 11.7–15.5)
MCH: 28.7 pg (ref 27.0–33.0)
MCHC: 33.1 g/dL (ref 32.0–36.0)
MCV: 86.8 fL (ref 80.0–100.0)
MPV: 10.7 fL (ref 7.5–12.5)
Monocytes Relative: 5.3 %
Neutro Abs: 5850 {cells}/uL (ref 1500–7800)
Neutrophils Relative %: 62.9 %
Platelets: 327 Thousand/uL (ref 140–400)
RBC: 4.25 Million/uL (ref 3.80–5.10)
RDW: 12.9 % (ref 11.0–15.0)
Total Lymphocyte: 29.9 %
WBC: 9.3 Thousand/uL (ref 3.8–10.8)

## 2024-04-22 LAB — HEMOGLOBIN A1C
Hgb A1c MFr Bld: 5.4 % (ref <5.7)
Mean Plasma Glucose: 108 mg/dL
eAG (mmol/L): 6 mmol/L

## 2024-04-22 LAB — HEPATITIS C ANTIBODY: Hepatitis C Ab: NONREACTIVE

## 2024-04-23 ENCOUNTER — Ambulatory Visit: Payer: Self-pay | Admitting: Internal Medicine

## 2024-06-09 ENCOUNTER — Other Ambulatory Visit: Payer: Self-pay | Admitting: Internal Medicine

## 2024-06-09 DIAGNOSIS — R0982 Postnasal drip: Secondary | ICD-10-CM

## 2024-06-10 NOTE — Telephone Encounter (Signed)
 Requested Prescriptions  Pending Prescriptions Disp Refills   Azelastine -Fluticasone  137-50 MCG/ACT SUSP [Pharmacy Med Name: AZELASTIN-FLUTIC 137-50MCG SPR] 23 g 3    Sig: PLACE 1 SPRAY INTO THE NOSE EVERY 12 (TWELVE) HOURS.     Ear, Nose, and Throat: Nasal Preparations - Corticosteroids Passed - 06/10/2024  1:48 PM      Passed - Valid encounter within last 12 months    Recent Outpatient Visits           1 month ago Primary hypertension   [facility] Bernardo Fend, DO   5 months ago Primary hypertension   [facility] Bernardo Fend, DO       Future Appointments             In 10 months Bernardo Fend, DO [facility], [place]

## 2024-06-12 ENCOUNTER — Telehealth (HOSPITAL_COMMUNITY): Payer: Self-pay | Admitting: Professional

## 2024-06-12 NOTE — Telephone Encounter (Signed)
 See call log

## 2024-06-16 ENCOUNTER — Telehealth (HOSPITAL_COMMUNITY): Payer: Self-pay | Admitting: Professional

## 2024-06-16 NOTE — Telephone Encounter (Signed)
 See call log

## 2024-06-18 ENCOUNTER — Telehealth (HOSPITAL_COMMUNITY): Payer: Self-pay | Admitting: Licensed Clinical Social Worker

## 2024-06-18 NOTE — Telephone Encounter (Signed)
 See call log

## 2024-06-24 ENCOUNTER — Telehealth (HOSPITAL_COMMUNITY): Payer: Self-pay | Admitting: Professional

## 2024-06-24 NOTE — Telephone Encounter (Signed)
 Call log

## 2024-10-08 ENCOUNTER — Other Ambulatory Visit: Payer: Self-pay | Admitting: Internal Medicine

## 2024-10-08 DIAGNOSIS — R0982 Postnasal drip: Secondary | ICD-10-CM

## 2024-10-09 NOTE — Telephone Encounter (Signed)
 Requested Prescriptions  Pending Prescriptions Disp Refills   Azelastine -Fluticasone  137-50 MCG/ACT SUSP [Pharmacy Med Name: AZELASTIN-FLUTIC 137-50MCG SPR] 23 g 3    Sig: PLACE 1 SPRAY INTO THE NOSE EVERY 12 (TWELVE) HOURS.     Ear, Nose, and Throat: Nasal Preparations - Corticosteroids Passed - 10/09/2024 10:52 AM      Passed - Valid encounter within last 12 months    Recent Outpatient Visits           5 months ago Primary hypertension   [facility] Bernardo Fend, DO   9 months ago Primary hypertension   [facility] Bernardo Fend, DO       Future Appointments             In 6 months Bernardo Fend, DO [facility], [place]

## 2025-04-22 ENCOUNTER — Ambulatory Visit: Admitting: Internal Medicine
